# Patient Record
Sex: Male | Born: 1945 | Race: White | Hispanic: No | Smoking: Never smoker
Health system: Southern US, Community
[De-identification: ages and names within clinical notes are randomized; demographics above are authoritative.]

## PROBLEM LIST (undated history)

## (undated) DIAGNOSIS — K635 Polyp of colon: Secondary | ICD-10-CM

## (undated) DIAGNOSIS — C61 Malignant neoplasm of prostate: Secondary | ICD-10-CM

## (undated) DIAGNOSIS — K5790 Diverticulosis of intestine, part unspecified, without perforation or abscess without bleeding: Secondary | ICD-10-CM

## (undated) DIAGNOSIS — K5792 Diverticulitis of intestine, part unspecified, without perforation or abscess without bleeding: Secondary | ICD-10-CM

## (undated) HISTORY — DX: Polyp of colon: K63.5

## (undated) HISTORY — DX: Diverticulitis of intestine, part unspecified, without perforation or abscess without bleeding: K57.92

## (undated) HISTORY — DX: Diverticulosis of intestine, part unspecified, without perforation or abscess without bleeding: K57.90

## (undated) HISTORY — PX: PROSTATE BIOPSY: SHX241

---
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
## 2006-04-22 ENCOUNTER — Ambulatory Visit: Payer: Self-pay | Admitting: Internal Medicine

## 2006-05-12 ENCOUNTER — Ambulatory Visit: Payer: Self-pay | Admitting: Internal Medicine

## 2006-05-12 ENCOUNTER — Encounter (INDEPENDENT_AMBULATORY_CARE_PROVIDER_SITE_OTHER): Payer: Self-pay | Admitting: Specialist

## 2009-04-18 ENCOUNTER — Ambulatory Visit: Payer: Self-pay | Admitting: Internal Medicine

## 2009-05-01 ENCOUNTER — Ambulatory Visit: Payer: Self-pay | Admitting: Internal Medicine

## 2009-05-01 ENCOUNTER — Encounter: Payer: Self-pay | Admitting: Internal Medicine

## 2009-05-02 ENCOUNTER — Encounter: Payer: Self-pay | Admitting: Internal Medicine

## 2011-06-17 ENCOUNTER — Encounter: Payer: Self-pay | Admitting: Podiatry

## 2011-07-15 ENCOUNTER — Other Ambulatory Visit: Payer: Self-pay | Admitting: Dermatology

## 2011-11-19 HISTORY — PX: COLONOSCOPY: SHX174

## 2012-03-13 ENCOUNTER — Encounter: Payer: Self-pay | Admitting: Internal Medicine

## 2012-03-18 ENCOUNTER — Encounter: Payer: Self-pay | Admitting: Internal Medicine

## 2012-04-14 DIAGNOSIS — H40019 Open angle with borderline findings, low risk, unspecified eye: Secondary | ICD-10-CM | POA: Diagnosis not present

## 2012-05-06 DIAGNOSIS — E785 Hyperlipidemia, unspecified: Secondary | ICD-10-CM | POA: Diagnosis not present

## 2012-05-06 DIAGNOSIS — Z125 Encounter for screening for malignant neoplasm of prostate: Secondary | ICD-10-CM | POA: Diagnosis not present

## 2012-05-06 DIAGNOSIS — R972 Elevated prostate specific antigen [PSA]: Secondary | ICD-10-CM | POA: Diagnosis not present

## 2012-05-13 DIAGNOSIS — I452 Bifascicular block: Secondary | ICD-10-CM | POA: Diagnosis not present

## 2012-05-13 DIAGNOSIS — E785 Hyperlipidemia, unspecified: Secondary | ICD-10-CM | POA: Diagnosis not present

## 2012-05-13 DIAGNOSIS — Z0000 Encounter for general adult medical examination without abnormal findings: Secondary | ICD-10-CM | POA: Diagnosis not present

## 2012-05-13 DIAGNOSIS — Z125 Encounter for screening for malignant neoplasm of prostate: Secondary | ICD-10-CM | POA: Diagnosis not present

## 2012-05-13 DIAGNOSIS — K5730 Diverticulosis of large intestine without perforation or abscess without bleeding: Secondary | ICD-10-CM | POA: Diagnosis not present

## 2012-05-18 ENCOUNTER — Ambulatory Visit (AMBULATORY_SURGERY_CENTER): Payer: Medicare Other | Admitting: *Deleted

## 2012-05-18 VITALS — Ht 66.0 in | Wt 165.0 lb

## 2012-05-18 DIAGNOSIS — Z1211 Encounter for screening for malignant neoplasm of colon: Secondary | ICD-10-CM

## 2012-05-18 MED ORDER — MOVIPREP 100 G PO SOLR: ORAL | Status: DC

## 2012-06-01 ENCOUNTER — Ambulatory Visit (AMBULATORY_SURGERY_CENTER): Payer: Medicare Other | Admitting: Internal Medicine

## 2012-06-01 ENCOUNTER — Encounter: Payer: Self-pay | Admitting: Internal Medicine

## 2012-06-01 VITALS — BP 183/79 | HR 95 | Temp 97.3°F | Resp 18 | Ht 66.0 in | Wt 165.0 lb

## 2012-06-01 DIAGNOSIS — Z86010 Personal history of colonic polyps: Secondary | ICD-10-CM | POA: Diagnosis not present

## 2012-06-01 DIAGNOSIS — Z1211 Encounter for screening for malignant neoplasm of colon: Secondary | ICD-10-CM

## 2012-06-01 DIAGNOSIS — D126 Benign neoplasm of colon, unspecified: Secondary | ICD-10-CM

## 2012-06-01 MED ORDER — SODIUM CHLORIDE 0.9 % IV SOLN: 500.0000 mL | INTRAVENOUS | Status: DC

## 2012-06-01 NOTE — Progress Notes (Signed)
Patient did not have preoperative order for IV antibiotic SSI prophylaxis. (G8918)  Patient did not experience any of the following events: a burn prior to discharge; a fall within the facility; wrong site/side/patient/procedure/implant event; or a hospital transfer or hospital admission upon discharge from the facility. (G8907)  

## 2012-06-01 NOTE — Op Note (Signed)
[facility] [address] [place], [place]  [address]  COLONOSCOPY PROCEDURE REPORT  PATIENT:  Terry Gay, Terry Gay  MR#:  [id number] BIRTHDATE:  21-Aug-1946, 66 yrs. old  GENDER:  male ENDOSCOPIST:  Wilhemina Bonito. Eda Keys, MD REF. BY:  Surveillance Program Recall, PROCEDURE DATE:  06/01/2012 PROCEDURE:  Colonoscopy with snare polypectomy x 2 ASA CLASS:  Class II INDICATIONS:  history of pre-cancerous (adenomatous) colon polyps, surveillance and high-risk screening ; prior exams 2003.04,07,10 w/ multiple adenomas MEDICATIONS:   MAC sedation, administered by CRNA, propofol (Diprivan) 350 mg IV  DESCRIPTION OF PROCEDURE:   After the risks benefits and alternatives of the procedure were thoroughly explained, informed consent was obtained.  Digital rectal exam was performed and revealed no abnormalities.   The LB [id number] endoscope was introduced through the anus and advanced to the cecum, which was identified by both the appendix and ileocecal valve, without limitations.  The quality of the prep was excellent, using MoviPrep.  The instrument was then slowly withdrawn as the colon was fully examined. <<PROCEDUREIMAGES>>  FINDINGS:  Two 3mm polyps were found in the cecum and ascending colon. Polyps were snared without cautery. Retrieval was successful.  Moderate diverticulosis was found throughout the colon.  Otherwise normal colonoscopy without other polyps, masses, vascular ectasias, or inflammatory changes.   Retroflexed views in the rectum revealed internal hemorrhoids.    The time to cecum = 2:45  minutes. The scope was then withdrawn in  9:45  minutes from the cecum and the procedure completed.  COMPLICATIONS:  None  ENDOSCOPIC IMPRESSION: 1) Two tiny polyps - removed 2) Moderate diverticulosis throughout the colon 3) Otherwise normal colonoscopy  RECOMMENDATIONS: 1) Follow up colonoscopy in 5 years  ______________________________ Wilhemina Bonito. Eda Keys, MD  CC:   The Patient;  Guerry Bruin, MD  n. eSIGNED:   Wilhemina Bonito. Eda Keys at 06/01/2012 10:00 AM  Burnett Harry, [phone]

## 2012-06-01 NOTE — Patient Instructions (Addendum)
YOU HAD AN ENDOSCOPIC PROCEDURE TODAY AT THE [facility]: Refer to the procedure report that was given to you for any specific questions about what was found during the examination.  If the procedure report does not answer your questions, please call your gastroenterologist to clarify.  If you requested that your care partner not be given the details of your procedure findings, then the procedure report has been included in a sealed envelope for you to review at your convenience later.  YOU SHOULD EXPECT: Some feelings of bloating in the abdomen. Passage of more gas than usual.  Walking can help get rid of the air that was put into your GI tract during the procedure and reduce the bloating. If you had a lower endoscopy (such as a colonoscopy or flexible sigmoidoscopy) you may notice spotting of blood in your stool or on the toilet paper. If you underwent a bowel prep for your procedure, then you may not have a normal bowel movement for a few days.  DIET: Your first meal following the procedure should be a light meal and then it is ok to progress to your normal diet.  A half-sandwich or bowl of soup is an example of a good first meal.  Heavy or fried foods are harder to digest and may make you feel nauseous or bloated.  Likewise meals heavy in dairy and vegetables can cause extra gas to form and this can also increase the bloating.  Drink plenty of fluids but you should avoid alcoholic beverages for 24 hours.  ACTIVITY: Your care partner should take you home directly after the procedure.  You should plan to take it easy, moving slowly for the rest of the day.  You can resume normal activity the day after the procedure however you should NOT DRIVE or use heavy machinery for 24 hours (because of the sedation medicines used during the test).    SYMPTOMS TO REPORT IMMEDIATELY: A gastroenterologist can be reached at any hour.  During normal business hours, 8:30 AM to 5:00 PM Monday through Friday,  call [phone].  After hours and on weekends, please call the GI answering service at [phone] who will take a message and have the physician on call contact you.   Following lower endoscopy (colonoscopy or flexible sigmoidoscopy):  Excessive amounts of blood in the stool  Significant tenderness or worsening of abdominal pains  Swelling of the abdomen that is new, acute  Fever of 100F or higher    FOLLOW UP: If any biopsies were taken you will be contacted by phone or by letter within the next 1-3 weeks.  Call your gastroenterologist if you have not heard about the biopsies in 3 weeks.  Our staff will call the home number listed on your records the next business day following your procedure to check on you and address any questions or concerns that you may have at that time regarding the information given to you following your procedure. This is a courtesy call and so if there is no answer at the home number and we have not heard from you through the emergency physician on call, we will assume that you have returned to your regular daily activities without incident.  SIGNATURES/CONFIDENTIALITY: You and/or your care partner have signed paperwork which will be entered into your electronic medical record.  These signatures attest to the fact that that the information above on your After Visit Summary has been reviewed and is understood.  Full responsibility of the confidentiality   of this discharge information lies with you and/or your care-partner.     

## 2012-06-02 ENCOUNTER — Telehealth: Payer: Self-pay | Admitting: *Deleted

## 2012-06-02 NOTE — Telephone Encounter (Signed)
  Follow up Call-  Call back number 06/01/2012  Post procedure Call Back phone  # [phone]  Permission to leave phone message Yes     Patient questions:  Do you have a fever, pain , or abdominal swelling? no Pain Score  0 *  Have you tolerated food without any problems? yes  Have you been able to return to your normal activities? yes  Do you have any questions about your discharge instructions: Diet   no Medications  no Follow up visit  no  Do you have questions or concerns about your Care? no  Actions: * If pain score is 4 or above: No action needed, pain <4.

## 2012-06-04 ENCOUNTER — Encounter: Payer: Self-pay | Admitting: Internal Medicine

## 2012-06-18 DIAGNOSIS — Z1212 Encounter for screening for malignant neoplasm of rectum: Secondary | ICD-10-CM | POA: Diagnosis not present

## 2012-10-26 DIAGNOSIS — L723 Sebaceous cyst: Secondary | ICD-10-CM | POA: Diagnosis not present

## 2013-01-28 DIAGNOSIS — H40019 Open angle with borderline findings, low risk, unspecified eye: Secondary | ICD-10-CM | POA: Diagnosis not present

## 2013-05-24 DIAGNOSIS — N529 Male erectile dysfunction, unspecified: Secondary | ICD-10-CM | POA: Diagnosis not present

## 2013-05-24 DIAGNOSIS — E785 Hyperlipidemia, unspecified: Secondary | ICD-10-CM | POA: Diagnosis not present

## 2013-05-24 DIAGNOSIS — Z125 Encounter for screening for malignant neoplasm of prostate: Secondary | ICD-10-CM | POA: Diagnosis not present

## 2013-05-31 DIAGNOSIS — Z6828 Body mass index (BMI) 28.0-28.9, adult: Secondary | ICD-10-CM | POA: Diagnosis not present

## 2013-05-31 DIAGNOSIS — Z0000 Encounter for general adult medical examination without abnormal findings: Secondary | ICD-10-CM | POA: Diagnosis not present

## 2013-05-31 DIAGNOSIS — D126 Benign neoplasm of colon, unspecified: Secondary | ICD-10-CM | POA: Diagnosis not present

## 2013-05-31 DIAGNOSIS — E785 Hyperlipidemia, unspecified: Secondary | ICD-10-CM | POA: Diagnosis not present

## 2013-05-31 DIAGNOSIS — F528 Other sexual dysfunction not due to a substance or known physiological condition: Secondary | ICD-10-CM | POA: Diagnosis not present

## 2013-05-31 DIAGNOSIS — Z125 Encounter for screening for malignant neoplasm of prostate: Secondary | ICD-10-CM | POA: Diagnosis not present

## 2013-05-31 DIAGNOSIS — H409 Unspecified glaucoma: Secondary | ICD-10-CM | POA: Diagnosis not present

## 2013-05-31 DIAGNOSIS — I10 Essential (primary) hypertension: Secondary | ICD-10-CM | POA: Diagnosis not present

## 2013-05-31 DIAGNOSIS — I452 Bifascicular block: Secondary | ICD-10-CM | POA: Diagnosis not present

## 2013-06-11 DIAGNOSIS — Z1212 Encounter for screening for malignant neoplasm of rectum: Secondary | ICD-10-CM | POA: Diagnosis not present

## 2013-10-20 DIAGNOSIS — Z23 Encounter for immunization: Secondary | ICD-10-CM | POA: Diagnosis not present

## 2013-10-21 DIAGNOSIS — Z23 Encounter for immunization: Secondary | ICD-10-CM | POA: Diagnosis not present

## 2014-01-27 DIAGNOSIS — H2510 Age-related nuclear cataract, unspecified eye: Secondary | ICD-10-CM | POA: Diagnosis not present

## 2014-01-27 DIAGNOSIS — H40019 Open angle with borderline findings, low risk, unspecified eye: Secondary | ICD-10-CM | POA: Diagnosis not present

## 2014-01-27 DIAGNOSIS — H35379 Puckering of macula, unspecified eye: Secondary | ICD-10-CM | POA: Diagnosis not present

## 2014-05-26 DIAGNOSIS — Z125 Encounter for screening for malignant neoplasm of prostate: Secondary | ICD-10-CM | POA: Diagnosis not present

## 2014-05-26 DIAGNOSIS — R82998 Other abnormal findings in urine: Secondary | ICD-10-CM | POA: Diagnosis not present

## 2014-05-26 DIAGNOSIS — R809 Proteinuria, unspecified: Secondary | ICD-10-CM | POA: Diagnosis not present

## 2014-05-26 DIAGNOSIS — I10 Essential (primary) hypertension: Secondary | ICD-10-CM | POA: Diagnosis not present

## 2014-05-26 DIAGNOSIS — E785 Hyperlipidemia, unspecified: Secondary | ICD-10-CM | POA: Diagnosis not present

## 2014-06-03 DIAGNOSIS — Z1331 Encounter for screening for depression: Secondary | ICD-10-CM | POA: Diagnosis not present

## 2014-06-03 DIAGNOSIS — I452 Bifascicular block: Secondary | ICD-10-CM | POA: Diagnosis not present

## 2014-06-03 DIAGNOSIS — F528 Other sexual dysfunction not due to a substance or known physiological condition: Secondary | ICD-10-CM | POA: Diagnosis not present

## 2014-06-03 DIAGNOSIS — Z0000 Encounter for general adult medical examination without abnormal findings: Secondary | ICD-10-CM | POA: Diagnosis not present

## 2014-06-03 DIAGNOSIS — Z23 Encounter for immunization: Secondary | ICD-10-CM | POA: Diagnosis not present

## 2014-06-03 DIAGNOSIS — Z125 Encounter for screening for malignant neoplasm of prostate: Secondary | ICD-10-CM | POA: Diagnosis not present

## 2014-06-03 DIAGNOSIS — I10 Essential (primary) hypertension: Secondary | ICD-10-CM | POA: Diagnosis not present

## 2014-06-03 DIAGNOSIS — H409 Unspecified glaucoma: Secondary | ICD-10-CM | POA: Diagnosis not present

## 2014-06-03 DIAGNOSIS — E785 Hyperlipidemia, unspecified: Secondary | ICD-10-CM | POA: Diagnosis not present

## 2014-06-07 DIAGNOSIS — Z1212 Encounter for screening for malignant neoplasm of rectum: Secondary | ICD-10-CM | POA: Diagnosis not present

## 2014-06-24 ENCOUNTER — Encounter: Payer: Self-pay | Admitting: Internal Medicine

## 2014-12-05 DIAGNOSIS — Z23 Encounter for immunization: Secondary | ICD-10-CM | POA: Diagnosis not present

## 2015-02-20 DIAGNOSIS — H35373 Puckering of macula, bilateral: Secondary | ICD-10-CM | POA: Diagnosis not present

## 2015-02-20 DIAGNOSIS — H40013 Open angle with borderline findings, low risk, bilateral: Secondary | ICD-10-CM | POA: Diagnosis not present

## 2015-06-05 DIAGNOSIS — Z125 Encounter for screening for malignant neoplasm of prostate: Secondary | ICD-10-CM | POA: Diagnosis not present

## 2015-06-05 DIAGNOSIS — R8290 Unspecified abnormal findings in urine: Secondary | ICD-10-CM | POA: Diagnosis not present

## 2015-06-05 DIAGNOSIS — I10 Essential (primary) hypertension: Secondary | ICD-10-CM | POA: Diagnosis not present

## 2015-06-05 DIAGNOSIS — N390 Urinary tract infection, site not specified: Secondary | ICD-10-CM | POA: Diagnosis not present

## 2015-06-05 DIAGNOSIS — E785 Hyperlipidemia, unspecified: Secondary | ICD-10-CM | POA: Diagnosis not present

## 2015-06-05 DIAGNOSIS — R972 Elevated prostate specific antigen [PSA]: Secondary | ICD-10-CM | POA: Diagnosis not present

## 2015-06-12 DIAGNOSIS — D126 Benign neoplasm of colon, unspecified: Secondary | ICD-10-CM | POA: Diagnosis not present

## 2015-06-12 DIAGNOSIS — Z8719 Personal history of other diseases of the digestive system: Secondary | ICD-10-CM | POA: Diagnosis not present

## 2015-06-12 DIAGNOSIS — R972 Elevated prostate specific antigen [PSA]: Secondary | ICD-10-CM | POA: Diagnosis not present

## 2015-06-12 DIAGNOSIS — Z1389 Encounter for screening for other disorder: Secondary | ICD-10-CM | POA: Diagnosis not present

## 2015-06-12 DIAGNOSIS — F5221 Male erectile disorder: Secondary | ICD-10-CM | POA: Diagnosis not present

## 2015-06-12 DIAGNOSIS — I444 Left anterior fascicular block: Secondary | ICD-10-CM | POA: Diagnosis not present

## 2015-06-12 DIAGNOSIS — I10 Essential (primary) hypertension: Secondary | ICD-10-CM | POA: Diagnosis not present

## 2015-06-12 DIAGNOSIS — Z0000 Encounter for general adult medical examination without abnormal findings: Secondary | ICD-10-CM | POA: Diagnosis not present

## 2015-06-12 DIAGNOSIS — E785 Hyperlipidemia, unspecified: Secondary | ICD-10-CM | POA: Diagnosis not present

## 2015-06-12 DIAGNOSIS — H409 Unspecified glaucoma: Secondary | ICD-10-CM | POA: Diagnosis not present

## 2015-06-12 DIAGNOSIS — Z6826 Body mass index (BMI) 26.0-26.9, adult: Secondary | ICD-10-CM | POA: Diagnosis not present

## 2015-06-12 DIAGNOSIS — K5730 Diverticulosis of large intestine without perforation or abscess without bleeding: Secondary | ICD-10-CM | POA: Diagnosis not present

## 2015-06-13 DIAGNOSIS — Z1212 Encounter for screening for malignant neoplasm of rectum: Secondary | ICD-10-CM | POA: Diagnosis not present

## 2015-11-29 DIAGNOSIS — Z23 Encounter for immunization: Secondary | ICD-10-CM | POA: Diagnosis not present

## 2016-01-17 DIAGNOSIS — R69 Illness, unspecified: Secondary | ICD-10-CM | POA: Diagnosis not present

## 2016-02-22 DIAGNOSIS — Z0100 Encounter for examination of eyes and vision without abnormal findings: Secondary | ICD-10-CM | POA: Diagnosis not present

## 2016-02-22 DIAGNOSIS — H35373 Puckering of macula, bilateral: Secondary | ICD-10-CM | POA: Diagnosis not present

## 2016-02-22 DIAGNOSIS — H2513 Age-related nuclear cataract, bilateral: Secondary | ICD-10-CM | POA: Diagnosis not present

## 2016-03-04 DIAGNOSIS — R69 Illness, unspecified: Secondary | ICD-10-CM | POA: Diagnosis not present

## 2016-06-05 DIAGNOSIS — R69 Illness, unspecified: Secondary | ICD-10-CM | POA: Diagnosis not present

## 2016-06-12 DIAGNOSIS — R972 Elevated prostate specific antigen [PSA]: Secondary | ICD-10-CM | POA: Diagnosis not present

## 2016-06-12 DIAGNOSIS — E784 Other hyperlipidemia: Secondary | ICD-10-CM | POA: Diagnosis not present

## 2016-06-12 DIAGNOSIS — Z125 Encounter for screening for malignant neoplasm of prostate: Secondary | ICD-10-CM | POA: Diagnosis not present

## 2016-06-12 DIAGNOSIS — I10 Essential (primary) hypertension: Secondary | ICD-10-CM | POA: Diagnosis not present

## 2016-06-19 DIAGNOSIS — Z0000 Encounter for general adult medical examination without abnormal findings: Secondary | ICD-10-CM | POA: Diagnosis not present

## 2016-06-19 DIAGNOSIS — I10 Essential (primary) hypertension: Secondary | ICD-10-CM | POA: Diagnosis not present

## 2016-06-19 DIAGNOSIS — H409 Unspecified glaucoma: Secondary | ICD-10-CM | POA: Diagnosis not present

## 2016-06-19 DIAGNOSIS — D126 Benign neoplasm of colon, unspecified: Secondary | ICD-10-CM | POA: Diagnosis not present

## 2016-06-19 DIAGNOSIS — I444 Left anterior fascicular block: Secondary | ICD-10-CM | POA: Diagnosis not present

## 2016-06-19 DIAGNOSIS — K5730 Diverticulosis of large intestine without perforation or abscess without bleeding: Secondary | ICD-10-CM | POA: Diagnosis not present

## 2016-06-19 DIAGNOSIS — Z8719 Personal history of other diseases of the digestive system: Secondary | ICD-10-CM | POA: Diagnosis not present

## 2016-06-19 DIAGNOSIS — R972 Elevated prostate specific antigen [PSA]: Secondary | ICD-10-CM | POA: Diagnosis not present

## 2016-06-19 DIAGNOSIS — R69 Illness, unspecified: Secondary | ICD-10-CM | POA: Diagnosis not present

## 2016-06-19 DIAGNOSIS — Z6827 Body mass index (BMI) 27.0-27.9, adult: Secondary | ICD-10-CM | POA: Diagnosis not present

## 2016-06-20 DIAGNOSIS — Z1212 Encounter for screening for malignant neoplasm of rectum: Secondary | ICD-10-CM | POA: Diagnosis not present

## 2016-09-02 DIAGNOSIS — L719 Rosacea, unspecified: Secondary | ICD-10-CM | POA: Diagnosis not present

## 2016-09-02 DIAGNOSIS — Z23 Encounter for immunization: Secondary | ICD-10-CM | POA: Diagnosis not present

## 2016-09-02 DIAGNOSIS — Z6827 Body mass index (BMI) 27.0-27.9, adult: Secondary | ICD-10-CM | POA: Diagnosis not present

## 2016-10-08 DIAGNOSIS — R69 Illness, unspecified: Secondary | ICD-10-CM | POA: Diagnosis not present

## 2017-02-17 DIAGNOSIS — R69 Illness, unspecified: Secondary | ICD-10-CM | POA: Diagnosis not present

## 2017-02-24 DIAGNOSIS — H52203 Unspecified astigmatism, bilateral: Secondary | ICD-10-CM | POA: Diagnosis not present

## 2017-02-24 DIAGNOSIS — H2513 Age-related nuclear cataract, bilateral: Secondary | ICD-10-CM | POA: Diagnosis not present

## 2017-02-24 DIAGNOSIS — H35373 Puckering of macula, bilateral: Secondary | ICD-10-CM | POA: Diagnosis not present

## 2017-02-24 DIAGNOSIS — H40013 Open angle with borderline findings, low risk, bilateral: Secondary | ICD-10-CM | POA: Diagnosis not present

## 2017-03-27 ENCOUNTER — Encounter: Payer: Self-pay | Admitting: Internal Medicine

## 2017-04-04 ENCOUNTER — Encounter: Payer: Self-pay | Admitting: Internal Medicine

## 2017-05-28 ENCOUNTER — Ambulatory Visit (AMBULATORY_SURGERY_CENTER): Payer: Self-pay

## 2017-05-28 VITALS — Ht 65.0 in | Wt 171.8 lb

## 2017-05-28 DIAGNOSIS — Z860100 Personal history of colon polyps, unspecified: Secondary | ICD-10-CM

## 2017-05-28 MED ORDER — SUPREP BOWEL PREP KIT 17.5-3.13-1.6 GM/177ML PO SOLN: 1.0000 | Freq: Once | ORAL | 0 refills | Status: AC

## 2017-05-28 NOTE — Progress Notes (Signed)
No allergies to eggs or soy No past problems with anesthesia No diet meds No home oxygen  Declined emmi 

## 2017-06-02 ENCOUNTER — Encounter: Payer: Self-pay | Admitting: Internal Medicine

## 2017-06-10 DIAGNOSIS — I10 Essential (primary) hypertension: Secondary | ICD-10-CM | POA: Diagnosis not present

## 2017-06-10 DIAGNOSIS — E7800 Pure hypercholesterolemia, unspecified: Secondary | ICD-10-CM | POA: Diagnosis not present

## 2017-06-10 DIAGNOSIS — Z125 Encounter for screening for malignant neoplasm of prostate: Secondary | ICD-10-CM | POA: Diagnosis not present

## 2017-06-11 ENCOUNTER — Encounter: Payer: Self-pay | Admitting: Internal Medicine

## 2017-06-11 ENCOUNTER — Ambulatory Visit (AMBULATORY_SURGERY_CENTER): Payer: Medicare HMO | Admitting: Internal Medicine

## 2017-06-11 VITALS — BP 122/71 | HR 66 | Temp 98.4°F | Resp 16 | Ht 65.0 in | Wt 171.0 lb

## 2017-06-11 DIAGNOSIS — Z86010 Personal history of colonic polyps: Secondary | ICD-10-CM | POA: Diagnosis not present

## 2017-06-11 DIAGNOSIS — D120 Benign neoplasm of cecum: Secondary | ICD-10-CM | POA: Diagnosis not present

## 2017-06-11 DIAGNOSIS — D122 Benign neoplasm of ascending colon: Secondary | ICD-10-CM

## 2017-06-11 HISTORY — PX: COLONOSCOPY WITH PROPOFOL: SHX5780

## 2017-06-11 MED ORDER — SODIUM CHLORIDE 0.9 % IV SOLN: 500.0000 mL | INTRAVENOUS | Status: AC

## 2017-06-11 NOTE — Op Note (Signed)
[place] [facility] Patient Name: Terry Gay Procedure Date: 06/11/2017 1:28 PM MRN: [id number] Endoscopist: Docia Chuck. Henrene Pastor , MD Age: 71 Referring MD:  Date of Birth: 24-Aug-1946 Gender: Male Account #: [id number] Procedure:                Colonoscopy, with snare polypectomy x 3 Indications:              High risk colon cancer surveillance: Personal                            history of multiple (3 or more) adenomas. Previous                            examinations 2003, 2004, 2007, 2010, 2013 Medicines:                Monitored Anesthesia Care Procedure:                Pre-Anesthesia Assessment:                           - Prior to the procedure, a History and Physical                            was performed, and patient medications and                            allergies were reviewed. The patient's tolerance of                            previous anesthesia was also reviewed. The risks                            and benefits of the procedure and the sedation                            options and risks were discussed with the patient.                            All questions were answered, and informed consent                            was obtained. Prior Anticoagulants: The patient has                            taken no previous anticoagulant or antiplatelet                            agents. ASA Grade Assessment: II - A patient with                            mild systemic disease. After reviewing the risks                            and benefits, the patient was deemed in  satisfactory condition to undergo the procedure.                           After obtaining informed consent, the colonoscope                            was passed under direct vision. Throughout the                            procedure, the patient's blood pressure, pulse, and                            oxygen saturations were monitored continuously. The    Colonoscope was introduced through the anus and                            advanced to the the cecum, identified by                            appendiceal orifice and ileocecal valve. The                            ileocecal valve, appendiceal orifice, and rectum                            were photographed. The quality of the bowel                            preparation was excellent. The colonoscopy was                            performed without difficulty. The patient tolerated                            the procedure well. The bowel preparation used was                            SUPREP. Scope In: 1:32:09 PM Scope Out: 1:48:19 PM Scope Withdrawal Time: 0 hours 11 minutes 52 seconds  Total Procedure Duration: 0 hours 16 minutes 10 seconds  Findings:                 Three polyps were found in the ascending colon and                            cecum. The polyps were 1 to 3 mm in size. These                            polyps were removed with a cold snare. Resection                            and retrieval were complete.                           Multiple small and large-mouthed  diverticula were                            found in the left colon and right colon.                           Internal hemorrhoids were found during retroflexion.                           The exam was otherwise without abnormality on                            direct and retroflexion views. Complications:            No immediate complications. Estimated blood loss:                            None. Estimated Blood Loss:     Estimated blood loss: none. Impression:               - Three 1 to 3 mm polyps in the ascending colon and                            in the cecum, removed with a cold snare. Resected                            and retrieved.                           - Diverticulosis in the left colon and in the right                            colon.                           - Internal hemorrhoids.                            - The examination was otherwise normal on direct                            and retroflexion views. Recommendation:           - Repeat colonoscopy in 5 years for surveillance.                           - Patient has a contact number available for                            emergencies. The signs and symptoms of potential                            delayed complications were discussed with the                            patient. Return to normal activities tomorrow.  Written discharge instructions were provided to the                            patient.                           - Resume previous diet.                           - Continue present medications.                           - Await pathology results. Docia Chuck. Henrene Pastor, MD 06/11/2017 1:53:05 PM This report has been signed electronically.

## 2017-06-11 NOTE — Patient Instructions (Signed)
**  Handouts given on polyps, diverticulosis, and High Fiber Diet**  YOU HAD AN ENDOSCOPIC PROCEDURE TODAY: Refer to the procedure report and other information in the discharge instructions given to you for any specific questions about what was found during the examination. If this information does not answer your questions, please call [facility] office at [phone] to clarify.   YOU SHOULD EXPECT: Some feelings of bloating in the abdomen. Passage of more gas than usual. Walking can help get rid of the air that was put into your GI tract during the procedure and reduce the bloating. If you had a lower endoscopy (such as a colonoscopy or flexible sigmoidoscopy) you may notice spotting of blood in your stool or on the toilet paper. Some abdominal soreness may be present for a day or two, also.  DIET: Your first meal following the procedure should be a light meal and then it is ok to progress to your normal diet. A half-sandwich or bowl of soup is an example of a good first meal. Heavy or fried foods are harder to digest and may make you feel nauseous or bloated. Drink plenty of fluids but you should avoid alcoholic beverages for 24 hours. If you had a esophageal dilation, please see attached instructions for diet.    ACTIVITY: Your care partner should take you home directly after the procedure. You should plan to take it easy, moving slowly for the rest of the day. You can resume normal activity the day after the procedure however YOU SHOULD NOT DRIVE, use power tools, machinery or perform tasks that involve climbing or major physical exertion for 24 hours (because of the sedation medicines used during the test).   SYMPTOMS TO REPORT IMMEDIATELY: A gastroenterologist can be reached at any hour. Please call [phone]  for any of the following symptoms:  Following lower endoscopy (colonoscopy, flexible sigmoidoscopy) Excessive amounts of blood in the stool  Significant tenderness, worsening of abdominal  pains  Swelling of the abdomen that is new, acute  Fever of 100 or higher    FOLLOW UP:  If any biopsies were taken you will be contacted by phone or by letter within the next 1-3 weeks. Call [phone]  if you have not heard about the biopsies in 3 weeks.  Please also call with any specific questions about appointments or follow up tests.

## 2017-06-11 NOTE — Progress Notes (Signed)
Pt's states no medical or surgical changes since previsit or office visit. 

## 2017-06-11 NOTE — Progress Notes (Signed)
To recovery, report to Thomas, RN, VSS 

## 2017-06-11 NOTE — Progress Notes (Signed)
Called to room to assist during endoscopic procedure.  Patient ID and intended procedure confirmed with present staff. Received instructions for my participation in the procedure from the performing physician.  

## 2017-06-12 ENCOUNTER — Telehealth: Payer: Self-pay

## 2017-06-12 NOTE — Telephone Encounter (Signed)
  Follow up Call-  Call back number 06/11/2017  Post procedure Call Back phone  # [phone]  Permission to leave phone message Yes  Some recent data might be hidden     Patient questions:  Do you have a fever, pain , or abdominal swelling? No. Pain Score  0 *  Have you tolerated food without any problems? Yes.    Have you been able to return to your normal activities? Yes.    Do you have any questions about your discharge instructions: Diet   No. Medications  No. Follow up visit  No.  Do you have questions or concerns about your Care? No.  Actions: * If pain score is 4 or above: No action needed, pain <4.

## 2017-06-16 ENCOUNTER — Encounter: Payer: Self-pay | Admitting: Internal Medicine

## 2017-06-25 DIAGNOSIS — Z1389 Encounter for screening for other disorder: Secondary | ICD-10-CM | POA: Diagnosis not present

## 2017-06-25 DIAGNOSIS — D126 Benign neoplasm of colon, unspecified: Secondary | ICD-10-CM | POA: Diagnosis not present

## 2017-06-25 DIAGNOSIS — I444 Left anterior fascicular block: Secondary | ICD-10-CM | POA: Diagnosis not present

## 2017-06-25 DIAGNOSIS — K5730 Diverticulosis of large intestine without perforation or abscess without bleeding: Secondary | ICD-10-CM | POA: Diagnosis not present

## 2017-06-25 DIAGNOSIS — R69 Illness, unspecified: Secondary | ICD-10-CM | POA: Diagnosis not present

## 2017-06-25 DIAGNOSIS — L718 Other rosacea: Secondary | ICD-10-CM | POA: Diagnosis not present

## 2017-06-25 DIAGNOSIS — I10 Essential (primary) hypertension: Secondary | ICD-10-CM | POA: Diagnosis not present

## 2017-06-25 DIAGNOSIS — H4089 Other specified glaucoma: Secondary | ICD-10-CM | POA: Diagnosis not present

## 2017-06-25 DIAGNOSIS — Z8719 Personal history of other diseases of the digestive system: Secondary | ICD-10-CM | POA: Diagnosis not present

## 2017-06-25 DIAGNOSIS — Z0000 Encounter for general adult medical examination without abnormal findings: Secondary | ICD-10-CM | POA: Diagnosis not present

## 2017-07-14 DIAGNOSIS — R69 Illness, unspecified: Secondary | ICD-10-CM | POA: Diagnosis not present

## 2017-09-23 DIAGNOSIS — R69 Illness, unspecified: Secondary | ICD-10-CM | POA: Diagnosis not present

## 2017-10-03 DIAGNOSIS — R69 Illness, unspecified: Secondary | ICD-10-CM | POA: Diagnosis not present

## 2017-10-07 DIAGNOSIS — R69 Illness, unspecified: Secondary | ICD-10-CM | POA: Diagnosis not present

## 2017-12-26 DIAGNOSIS — Z1389 Encounter for screening for other disorder: Secondary | ICD-10-CM | POA: Diagnosis not present

## 2017-12-26 DIAGNOSIS — Z6828 Body mass index (BMI) 28.0-28.9, adult: Secondary | ICD-10-CM | POA: Diagnosis not present

## 2017-12-26 DIAGNOSIS — M76899 Other specified enthesopathies of unspecified lower limb, excluding foot: Secondary | ICD-10-CM | POA: Diagnosis not present

## 2017-12-31 DIAGNOSIS — R69 Illness, unspecified: Secondary | ICD-10-CM | POA: Diagnosis not present

## 2018-02-25 DIAGNOSIS — H35373 Puckering of macula, bilateral: Secondary | ICD-10-CM | POA: Diagnosis not present

## 2018-02-25 DIAGNOSIS — Z961 Presence of intraocular lens: Secondary | ICD-10-CM | POA: Diagnosis not present

## 2018-02-25 DIAGNOSIS — H52203 Unspecified astigmatism, bilateral: Secondary | ICD-10-CM | POA: Diagnosis not present

## 2018-05-19 DIAGNOSIS — R69 Illness, unspecified: Secondary | ICD-10-CM | POA: Diagnosis not present

## 2018-06-29 DIAGNOSIS — R791 Abnormal coagulation profile: Secondary | ICD-10-CM | POA: Diagnosis not present

## 2018-06-29 DIAGNOSIS — I10 Essential (primary) hypertension: Secondary | ICD-10-CM | POA: Diagnosis not present

## 2018-06-29 DIAGNOSIS — R972 Elevated prostate specific antigen [PSA]: Secondary | ICD-10-CM | POA: Diagnosis not present

## 2018-06-29 DIAGNOSIS — R82998 Other abnormal findings in urine: Secondary | ICD-10-CM | POA: Diagnosis not present

## 2018-06-29 DIAGNOSIS — E7800 Pure hypercholesterolemia, unspecified: Secondary | ICD-10-CM | POA: Diagnosis not present

## 2018-06-29 DIAGNOSIS — Z125 Encounter for screening for malignant neoplasm of prostate: Secondary | ICD-10-CM | POA: Diagnosis not present

## 2018-07-06 DIAGNOSIS — D692 Other nonthrombocytopenic purpura: Secondary | ICD-10-CM | POA: Diagnosis not present

## 2018-07-06 DIAGNOSIS — L718 Other rosacea: Secondary | ICD-10-CM | POA: Diagnosis not present

## 2018-07-06 DIAGNOSIS — R972 Elevated prostate specific antigen [PSA]: Secondary | ICD-10-CM | POA: Diagnosis not present

## 2018-07-06 DIAGNOSIS — K5730 Diverticulosis of large intestine without perforation or abscess without bleeding: Secondary | ICD-10-CM | POA: Diagnosis not present

## 2018-07-06 DIAGNOSIS — I444 Left anterior fascicular block: Secondary | ICD-10-CM | POA: Diagnosis not present

## 2018-07-06 DIAGNOSIS — Z0000 Encounter for general adult medical examination without abnormal findings: Secondary | ICD-10-CM | POA: Diagnosis not present

## 2018-07-06 DIAGNOSIS — R69 Illness, unspecified: Secondary | ICD-10-CM | POA: Diagnosis not present

## 2018-07-06 DIAGNOSIS — D126 Benign neoplasm of colon, unspecified: Secondary | ICD-10-CM | POA: Diagnosis not present

## 2018-07-06 DIAGNOSIS — I10 Essential (primary) hypertension: Secondary | ICD-10-CM | POA: Diagnosis not present

## 2018-07-06 DIAGNOSIS — Z8719 Personal history of other diseases of the digestive system: Secondary | ICD-10-CM | POA: Diagnosis not present

## 2018-07-10 DIAGNOSIS — Z1212 Encounter for screening for malignant neoplasm of rectum: Secondary | ICD-10-CM | POA: Diagnosis not present

## 2018-08-13 DIAGNOSIS — L570 Actinic keratosis: Secondary | ICD-10-CM | POA: Diagnosis not present

## 2018-08-13 DIAGNOSIS — L812 Freckles: Secondary | ICD-10-CM | POA: Diagnosis not present

## 2018-08-13 DIAGNOSIS — L821 Other seborrheic keratosis: Secondary | ICD-10-CM | POA: Diagnosis not present

## 2018-08-13 DIAGNOSIS — D1801 Hemangioma of skin and subcutaneous tissue: Secondary | ICD-10-CM | POA: Diagnosis not present

## 2018-08-31 DIAGNOSIS — R69 Illness, unspecified: Secondary | ICD-10-CM | POA: Diagnosis not present

## 2018-09-14 DIAGNOSIS — R69 Illness, unspecified: Secondary | ICD-10-CM | POA: Diagnosis not present

## 2019-01-06 DIAGNOSIS — R69 Illness, unspecified: Secondary | ICD-10-CM | POA: Diagnosis not present

## 2019-04-26 DIAGNOSIS — H35373 Puckering of macula, bilateral: Secondary | ICD-10-CM | POA: Diagnosis not present

## 2019-04-26 DIAGNOSIS — H52203 Unspecified astigmatism, bilateral: Secondary | ICD-10-CM | POA: Diagnosis not present

## 2019-04-26 DIAGNOSIS — H2513 Age-related nuclear cataract, bilateral: Secondary | ICD-10-CM | POA: Diagnosis not present

## 2019-05-05 DIAGNOSIS — R69 Illness, unspecified: Secondary | ICD-10-CM | POA: Diagnosis not present

## 2019-07-02 DIAGNOSIS — I10 Essential (primary) hypertension: Secondary | ICD-10-CM | POA: Diagnosis not present

## 2019-07-02 DIAGNOSIS — R972 Elevated prostate specific antigen [PSA]: Secondary | ICD-10-CM | POA: Diagnosis not present

## 2019-07-02 DIAGNOSIS — R82998 Other abnormal findings in urine: Secondary | ICD-10-CM | POA: Diagnosis not present

## 2019-07-02 DIAGNOSIS — Z125 Encounter for screening for malignant neoplasm of prostate: Secondary | ICD-10-CM | POA: Diagnosis not present

## 2019-07-08 DIAGNOSIS — E7800 Pure hypercholesterolemia, unspecified: Secondary | ICD-10-CM | POA: Diagnosis not present

## 2019-07-08 DIAGNOSIS — I10 Essential (primary) hypertension: Secondary | ICD-10-CM | POA: Diagnosis not present

## 2019-07-08 DIAGNOSIS — L719 Rosacea, unspecified: Secondary | ICD-10-CM | POA: Diagnosis not present

## 2019-07-08 DIAGNOSIS — Z1339 Encounter for screening examination for other mental health and behavioral disorders: Secondary | ICD-10-CM | POA: Diagnosis not present

## 2019-07-08 DIAGNOSIS — R69 Illness, unspecified: Secondary | ICD-10-CM | POA: Diagnosis not present

## 2019-07-08 DIAGNOSIS — Z23 Encounter for immunization: Secondary | ICD-10-CM | POA: Diagnosis not present

## 2019-07-08 DIAGNOSIS — R972 Elevated prostate specific antigen [PSA]: Secondary | ICD-10-CM | POA: Diagnosis not present

## 2019-07-08 DIAGNOSIS — D126 Benign neoplasm of colon, unspecified: Secondary | ICD-10-CM | POA: Diagnosis not present

## 2019-07-08 DIAGNOSIS — N401 Enlarged prostate with lower urinary tract symptoms: Secondary | ICD-10-CM | POA: Diagnosis not present

## 2019-07-08 DIAGNOSIS — Z0000 Encounter for general adult medical examination without abnormal findings: Secondary | ICD-10-CM | POA: Diagnosis not present

## 2019-07-08 DIAGNOSIS — H409 Unspecified glaucoma: Secondary | ICD-10-CM | POA: Diagnosis not present

## 2019-08-23 DIAGNOSIS — L821 Other seborrheic keratosis: Secondary | ICD-10-CM | POA: Diagnosis not present

## 2019-08-23 DIAGNOSIS — L718 Other rosacea: Secondary | ICD-10-CM | POA: Diagnosis not present

## 2019-08-23 DIAGNOSIS — L820 Inflamed seborrheic keratosis: Secondary | ICD-10-CM | POA: Diagnosis not present

## 2019-08-23 DIAGNOSIS — L812 Freckles: Secondary | ICD-10-CM | POA: Diagnosis not present

## 2019-08-23 DIAGNOSIS — D1801 Hemangioma of skin and subcutaneous tissue: Secondary | ICD-10-CM | POA: Diagnosis not present

## 2019-09-01 DIAGNOSIS — R69 Illness, unspecified: Secondary | ICD-10-CM | POA: Diagnosis not present

## 2019-09-30 DIAGNOSIS — D23122 Other benign neoplasm of skin of left lower eyelid, including canthus: Secondary | ICD-10-CM | POA: Diagnosis not present

## 2019-10-01 ENCOUNTER — Other Ambulatory Visit: Payer: Self-pay | Admitting: Ophthalmology

## 2019-10-01 DIAGNOSIS — L570 Actinic keratosis: Secondary | ICD-10-CM | POA: Diagnosis not present

## 2019-10-01 DIAGNOSIS — D485 Neoplasm of uncertain behavior of skin: Secondary | ICD-10-CM | POA: Diagnosis not present

## 2019-11-19 DIAGNOSIS — C61 Malignant neoplasm of prostate: Secondary | ICD-10-CM

## 2019-11-19 HISTORY — DX: Malignant neoplasm of prostate: C61

## 2020-01-03 DIAGNOSIS — R69 Illness, unspecified: Secondary | ICD-10-CM | POA: Diagnosis not present

## 2020-01-10 DIAGNOSIS — R69 Illness, unspecified: Secondary | ICD-10-CM | POA: Diagnosis not present

## 2020-01-14 ENCOUNTER — Ambulatory Visit: Payer: Medicare HMO

## 2020-04-28 DIAGNOSIS — H2513 Age-related nuclear cataract, bilateral: Secondary | ICD-10-CM | POA: Diagnosis not present

## 2020-04-28 DIAGNOSIS — H524 Presbyopia: Secondary | ICD-10-CM | POA: Diagnosis not present

## 2020-04-28 DIAGNOSIS — H52203 Unspecified astigmatism, bilateral: Secondary | ICD-10-CM | POA: Diagnosis not present

## 2020-04-28 DIAGNOSIS — H35373 Puckering of macula, bilateral: Secondary | ICD-10-CM | POA: Diagnosis not present

## 2020-05-08 DIAGNOSIS — R69 Illness, unspecified: Secondary | ICD-10-CM | POA: Diagnosis not present

## 2020-07-03 DIAGNOSIS — E7800 Pure hypercholesterolemia, unspecified: Secondary | ICD-10-CM | POA: Diagnosis not present

## 2020-07-10 DIAGNOSIS — H409 Unspecified glaucoma: Secondary | ICD-10-CM | POA: Diagnosis not present

## 2020-07-10 DIAGNOSIS — R972 Elevated prostate specific antigen [PSA]: Secondary | ICD-10-CM | POA: Diagnosis not present

## 2020-07-10 DIAGNOSIS — Z1212 Encounter for screening for malignant neoplasm of rectum: Secondary | ICD-10-CM | POA: Diagnosis not present

## 2020-07-10 DIAGNOSIS — E7800 Pure hypercholesterolemia, unspecified: Secondary | ICD-10-CM | POA: Diagnosis not present

## 2020-07-10 DIAGNOSIS — R82998 Other abnormal findings in urine: Secondary | ICD-10-CM | POA: Diagnosis not present

## 2020-07-10 DIAGNOSIS — K5730 Diverticulosis of large intestine without perforation or abscess without bleeding: Secondary | ICD-10-CM | POA: Diagnosis not present

## 2020-07-10 DIAGNOSIS — I444 Left anterior fascicular block: Secondary | ICD-10-CM | POA: Diagnosis not present

## 2020-07-10 DIAGNOSIS — D126 Benign neoplasm of colon, unspecified: Secondary | ICD-10-CM | POA: Diagnosis not present

## 2020-07-10 DIAGNOSIS — R69 Illness, unspecified: Secondary | ICD-10-CM | POA: Diagnosis not present

## 2020-07-10 DIAGNOSIS — Z125 Encounter for screening for malignant neoplasm of prostate: Secondary | ICD-10-CM | POA: Diagnosis not present

## 2020-07-10 DIAGNOSIS — Z0000 Encounter for general adult medical examination without abnormal findings: Secondary | ICD-10-CM | POA: Diagnosis not present

## 2020-07-10 DIAGNOSIS — I10 Essential (primary) hypertension: Secondary | ICD-10-CM | POA: Diagnosis not present

## 2020-07-10 DIAGNOSIS — N401 Enlarged prostate with lower urinary tract symptoms: Secondary | ICD-10-CM | POA: Diagnosis not present

## 2020-08-10 DIAGNOSIS — N400 Enlarged prostate without lower urinary tract symptoms: Secondary | ICD-10-CM | POA: Diagnosis not present

## 2020-08-10 DIAGNOSIS — R972 Elevated prostate specific antigen [PSA]: Secondary | ICD-10-CM | POA: Diagnosis not present

## 2020-08-28 DIAGNOSIS — L821 Other seborrheic keratosis: Secondary | ICD-10-CM | POA: Diagnosis not present

## 2020-08-28 DIAGNOSIS — L570 Actinic keratosis: Secondary | ICD-10-CM | POA: Diagnosis not present

## 2020-09-06 DIAGNOSIS — R69 Illness, unspecified: Secondary | ICD-10-CM | POA: Diagnosis not present

## 2020-09-14 DIAGNOSIS — R972 Elevated prostate specific antigen [PSA]: Secondary | ICD-10-CM | POA: Diagnosis not present

## 2020-09-14 DIAGNOSIS — N419 Inflammatory disease of prostate, unspecified: Secondary | ICD-10-CM | POA: Diagnosis not present

## 2020-09-14 DIAGNOSIS — C61 Malignant neoplasm of prostate: Secondary | ICD-10-CM | POA: Diagnosis not present

## 2020-09-20 DIAGNOSIS — R350 Frequency of micturition: Secondary | ICD-10-CM | POA: Diagnosis not present

## 2020-09-20 DIAGNOSIS — C61 Malignant neoplasm of prostate: Secondary | ICD-10-CM | POA: Diagnosis not present

## 2020-09-21 DIAGNOSIS — L738 Other specified follicular disorders: Secondary | ICD-10-CM | POA: Diagnosis not present

## 2020-09-22 ENCOUNTER — Other Ambulatory Visit (HOSPITAL_COMMUNITY): Payer: Self-pay | Admitting: Urology

## 2020-09-22 ENCOUNTER — Other Ambulatory Visit: Payer: Self-pay | Admitting: Urology

## 2020-09-22 DIAGNOSIS — C61 Malignant neoplasm of prostate: Secondary | ICD-10-CM

## 2020-10-06 ENCOUNTER — Encounter (HOSPITAL_COMMUNITY)
Admission: RE | Admit: 2020-10-06 | Discharge: 2020-10-06 | Disposition: A | Discharge status: Home / Self Care | Payer: Medicare HMO | Source: Ambulatory Visit | Attending: Urology | Admitting: Urology

## 2020-10-06 ENCOUNTER — Other Ambulatory Visit: Payer: Self-pay

## 2020-10-06 DIAGNOSIS — C61 Malignant neoplasm of prostate: Secondary | ICD-10-CM | POA: Diagnosis not present

## 2020-10-06 DIAGNOSIS — N200 Calculus of kidney: Secondary | ICD-10-CM | POA: Diagnosis not present

## 2020-10-06 DIAGNOSIS — K5730 Diverticulosis of large intestine without perforation or abscess without bleeding: Secondary | ICD-10-CM | POA: Diagnosis not present

## 2020-10-06 IMAGING — NM NM BONE WHOLE BODY
2 series · 2 of 2 positions shown · non-contrast
Comparison: CT abdomen and pelvis with bony reformats [DATE].

CLINICAL DATA: Prostate carcinoma

EXAM:
NUCLEAR MEDICINE WHOLE BODY BONE SCAN
TECHNIQUE: Whole body anterior and posterior images were obtained approximately
3 hours after intravenous injection of radiopharmaceutical.
RADIOPHARMACEUTICALS:  20.5 mCi [FI] MDP IV

[Series 1: whole body · 2.66mm/px · 1 of 1 slices shown (1 of 2)]
[im 1/1]
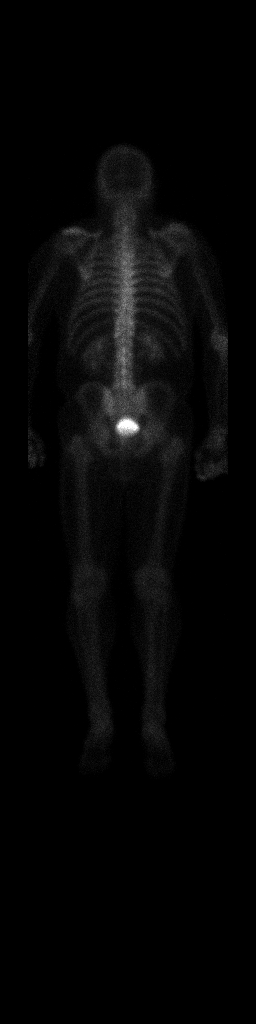

[Series 1: whole body · 2.66mm/px · 1 of 1 slices shown (2 of 2)]
[im 1/1]
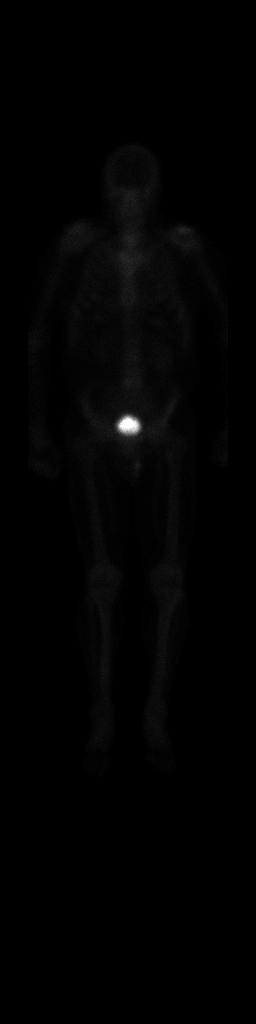

[2 of 2 positions shown; findings below may reference images not displayed]

FINDINGS: Modest increased uptake at L5 most likely is due to arthropathy
based on CT images through this area. Elsewhere, the distribution of
uptake of radiotracer is unremarkable. Kidneys are noted in the
flank positions bilaterally.
IMPRESSION: Probable arthropathy in the L5 region. No findings that are felt to
be indicative of bony metastatic disease.

## 2020-10-06 MED ORDER — TECHNETIUM TC 99M MEDRONATE IV KIT
20.5000 | PACK | Freq: Once | INTRAVENOUS | Status: AC
  Administered 2020-10-06: 20.5 via INTRAVENOUS

## 2020-10-26 DIAGNOSIS — C61 Malignant neoplasm of prostate: Secondary | ICD-10-CM | POA: Diagnosis not present

## 2020-11-13 ENCOUNTER — Encounter: Payer: Self-pay | Admitting: Radiation Oncology

## 2020-11-13 NOTE — Progress Notes (Signed)
GU Location of Tumor / Histology: prostatic adenocarcinoma  If Prostate Cancer, Gleason Score is (4 + 5) and PSA is (6.8). Prostate volume: 67.64  Terry Gay explains his PSA was over 5 last year. Patient explains his brother's PSA is elevated as well but he has not been diagnosed with prostate cancer.  Biopsies of prostate (if applicable) revealed:    Past/Anticipated interventions by urology, if any: prostate biopsy, CT abd/pelvis (negative), bone scan (negative), referral to Dr. Tammi Klippel to discuss radiation options  Past/Anticipated interventions by medical oncology, if any: no  Weight changes, if any: denies  Bowel/Bladder complaints, if any: IPSS 8. SHIM 22. Reports mild urinary frequency. Reports an intermittent urine stream. Reports nocturia x 2. Denies dysuria, hematuria, urinary leakage or incontinence.   Nausea/Vomiting, if any: denies  Pain issues, if any:  denies  SAFETY ISSUES:  Prior radiation? denies  Pacemaker/ICD? denies  Possible current pregnancy? no, male patient  Is the patient on methotrexate? denies  Current Complaints / other details:  74 year old male. Divorced with one son. Resides in [place]. Mother with hx of esophageal cancer. Brother with a hx of colon cancer.

## 2020-11-14 ENCOUNTER — Ambulatory Visit
Admission: RE | Admit: 2020-11-14 | Discharge: 2020-11-14 | Disposition: A | Discharge status: Home / Self Care | Payer: Medicare HMO | Source: Ambulatory Visit | Attending: Radiation Oncology | Admitting: Radiation Oncology

## 2020-11-14 ENCOUNTER — Other Ambulatory Visit: Payer: Self-pay

## 2020-11-14 ENCOUNTER — Encounter: Payer: Self-pay | Admitting: Radiation Oncology

## 2020-11-14 ENCOUNTER — Encounter: Payer: Self-pay | Admitting: Medical Oncology

## 2020-11-14 VITALS — BP 192/100 | HR 116 | Temp 97.8°F | Resp 18 | Ht 65.0 in | Wt 167.2 lb

## 2020-11-14 DIAGNOSIS — Z7982 Long term (current) use of aspirin: Secondary | ICD-10-CM | POA: Diagnosis not present

## 2020-11-14 DIAGNOSIS — Z800 Family history of malignant neoplasm of digestive organs: Secondary | ICD-10-CM | POA: Insufficient documentation

## 2020-11-14 DIAGNOSIS — C61 Malignant neoplasm of prostate: Secondary | ICD-10-CM | POA: Insufficient documentation

## 2020-11-14 DIAGNOSIS — Z86010 Personal history of colonic polyps: Secondary | ICD-10-CM | POA: Diagnosis not present

## 2020-11-14 DIAGNOSIS — N400 Enlarged prostate without lower urinary tract symptoms: Secondary | ICD-10-CM | POA: Diagnosis not present

## 2020-11-14 DIAGNOSIS — Z808 Family history of malignant neoplasm of other organs or systems: Secondary | ICD-10-CM | POA: Diagnosis not present

## 2020-11-14 HISTORY — DX: Malignant neoplasm of prostate: C61

## 2020-11-14 NOTE — Progress Notes (Signed)
Radiation Oncology         [phone] ________________________________  Initial outpatient Consultation  Name: Terry Gay MRN: [id number]  Date: 11/14/2020  DOB: [birth year]  EL:[id number], Adelfa Koh, MD  Jannifer Hick, MD   REFERRING PHYSICIAN: Jannifer Hick, MD  DIAGNOSIS: 74 y.o. gentleman with Stage T1c adenocarcinoma of the prostate with Gleason score of 4+5, and PSA of 6.8.    ICD-10-CM   1. Malignant neoplasm of prostate (HCC)  C61     HISTORY OF PRESENT ILLNESS: Terry Gay is a 74 y.o. male with a diagnosis of prostate cancer. He was noted to have an elevated PSA of 6.8 by his primary care physician, Dr. Wylene Simmer.  He reports he has a history of BPH and prostatitis, with a prior PSA of up to 12, that was treated with antibiotics and then returned to normal. He also notes that his PSA has been elevated a little over 5 intermittently over the years but he has always had a high %free PSA, indicating more likely secondary to the BPH.  More recently, his PSA was noted to be further elevated at 6.8 on 07/11/2020, so he was referred for evaluation in urology by Dr. Alvester Morin on 08/10/2020. A digital rectal examination was performed at that time revealing no nodules.  Following this visit, his care was transferred to Dr. Cardell Peach due to Dr. Alvester Morin being out on medical leave. The patient proceeded to transrectal ultrasound with 12 biopsies of the prostate on 09/14/2020.  The prostate volume measured 67.64 cc.  Out of 12 core biopsies, 6 were positive, all on the right.  The maximum Gleason score was 4+5, and this was seen in the right base (with PNI). Additionally, Gleason 4+4 was seen in the right base lateral, right mid lateral (with PNI), and right apex lateral, and Gleason 4+3 in the right apex (with PNI) and right mid.  He underwent disease staging scans on 10/06/2020 with CT A/P which showed no evidence of mass or lymphadenopathy and a bone scan which showed no findings felt to be indicative of  bony metastatic disease.  The patient reviewed the biopsy results with his urologist and he has kindly been referred today for discussion of potential radiation treatment options. He is scheduled to follow up with Dr. Cardell Peach on 11/20/2020.   PREVIOUS RADIATION THERAPY: No  PAST MEDICAL HISTORY:  Past Medical History:  Diagnosis Date  . Colon polyps   . Diverticulitis   . Diverticulosis   . Prostate cancer (HCC)       PAST SURGICAL HISTORY: Past Surgical History:  Procedure Laterality Date  . FRACTURE SURGERY  1958   right leg  . PROSTATE BIOPSY      FAMILY HISTORY:  Family History  Problem Relation Age of Onset  . Colon cancer Brother 17  . Esophageal cancer Mother   . Breast cancer Neg Hx   . Prostate cancer Neg Hx   . Pancreatic cancer Neg Hx     SOCIAL HISTORY:  Social History   Socioeconomic History  . Marital status: Divorced    Spouse name: Not on file  . Number of children: 1  . Years of education: Not on file  . Highest education level: Not on file  Occupational History    Comment: retired  Tobacco Use  . Smoking status: Never Smoker  . Smokeless tobacco: Never Used  Vaping Use  . Vaping Use: Never used  Substance and Sexual Activity  . Alcohol use: Yes  Alcohol/week: 10.0 standard drinks    Types: 10 Cans of beer per week  . Drug use: No  . Sexual activity: Yes  Other Topics Concern  . Not on file  Social History Narrative  . Not on file   Social Determinants of Health   Financial Resource Strain: Not on file  Food Insecurity: Not on file  Transportation Needs: Not on file  Physical Activity: Not on file  Stress: Not on file  Social Connections: Not on file  Intimate Partner Violence: Not on file    ALLERGIES: Patient has no known allergies.  MEDICATIONS:  Current Outpatient Medications  Medication Sig Dispense Refill  . naproxen sodium (ALEVE) 220 MG tablet Take 220 mg by mouth.    Marland Kitchen aspirin 325 MG tablet Take 325 mg by mouth every  6 (six) hours as needed. pain (Patient not taking: Reported on 11/14/2020)    . cetirizine (ZYRTEC) 10 MG chewable tablet Chew 10 mg by mouth daily. (Patient not taking: Reported on 11/14/2020)    . doxycycline (VIBRAMYCIN) 100 MG capsule Take 100 mg by mouth 2 (two) times daily. (Patient not taking: Reported on 11/14/2020)     No current facility-administered medications for this encounter.    REVIEW OF SYSTEMS:  On review of systems, the patient reports that he is doing well overall. He denies any chest pain, shortness of breath, cough, fevers, chills, night sweats, unintended weight changes. He denies any bowel disturbances, and denies abdominal pain, nausea or vomiting. He denies any new musculoskeletal or joint aches or pains. His IPSS was 8, indicating mild urinary symptoms. He reports urinary frequency and an intermittent urine stream, as well as nocturia x2. His SHIM was 22, indicating he has minimal erectile dysfunction. A complete review of systems is obtained and is otherwise negative.    PHYSICAL EXAM:  Wt Readings from Last 3 Encounters:  11/14/20 167 lb 3.2 oz (75.8 kg)  06/11/17 171 lb (77.6 kg)  05/28/17 171 lb 12.8 oz (77.9 kg)   Temp Readings from Last 3 Encounters:  11/14/20 97.8 F (36.6 C)  06/11/17 98.4 F (36.9 C)  06/01/12 97.3 F (36.3 C)   BP Readings from Last 3 Encounters:  11/14/20 (!) 192/100  06/11/17 122/71  06/01/12 (!) 183/79   Pulse Readings from Last 3 Encounters:  11/14/20 (!) 116  06/11/17 66  06/01/12 95   Pain Assessment Pain Score: 0-No pain/10  In general this is a well appearing Caucasian man in no acute distress. He's alert and oriented x4 and appropriate throughout the examination. Cardiopulmonary assessment is negative for acute distress, and he exhibits normal effort.     KPS = 90  100 - Normal; no complaints; no evidence of disease. 90   - Able to carry on normal activity; minor signs or symptoms of disease. 80   - Normal  activity with effort; some signs or symptoms of disease. 60   - Cares for self; unable to carry on normal activity or to do active work. 60   - Requires occasional assistance, but is able to care for most of his personal needs. 50   - Requires considerable assistance and frequent medical care. 6   - Disabled; requires special care and assistance. 39   - Severely disabled; hospital admission is indicated although death not imminent. 62   - Very sick; hospital admission necessary; active supportive treatment necessary. 10   - Moribund; fatal processes progressing rapidly. 0     - Dead  Karnofsky DA, Abelmann WH, Craver LS and [facility] [phone]) The use of the nitrogen mustards in the palliative treatment of carcinoma: with particular reference to bronchogenic carcinoma Cancer 1 634-56  LABORATORY DATA:  No results found for: WBC, HGB, HCT, MCV, PLT No results found for: NA, K, CL, CO2 No results found for: ALT, AST, GGT, ALKPHOS, BILITOT   RADIOGRAPHY: No results found.    IMPRESSION/PLAN: 1. 74 y.o. gentleman with Stage T1c adenocarcinoma of the prostate with Gleason Score of 4+5, and PSA of 6.8. We discussed the patient's workup and outlined the nature of prostate cancer in this setting with the patient and his significant other. The patient's T stage, Gleason's score, and PSA put him into the high risk group. Accordingly, he is eligible for a variety of potential treatment options including prostatectomy or LT-ADT in combination with either 8 weeks of external radiation or 5 weeks of external radiation with an upfront brachytherapy boost. We discussed the available radiation techniques, and focused on the details and logistics of delivery. The patient is not an ideal candidate for brachytherapy boost with a prostate volume of 67.64 and moderate LUTS. We discussed and outlined the risks, benefits, short and long-term effects associated with radiotherapy and compared and contrasted these with  prostatectomy. We discussed the role of SpaceOAR gel in reducing the rectal toxicity associated with radiotherapy. We also detailed the role of ADT in the treatment of high risk prostate cancer and outlined the associated side effects that could be expected with this therapy.  He and his significant other appear to have a good understanding of his disease and our treatment recommendations which are of curative intent. They were encouraged to ask questions that were answered to their stated satisfaction.  At the conclusion of our conversation, the patient is interested in moving forward with 8 weeks of external beam therapy in combination with LT-ADT. We will share our discussion with Dr. Abner Greenspan and make arrangements for start of ADT, first available.  The patient has a scheduled follow-up visit with Dr. Abner Greenspan on 11/21/2019 and is hopeful that he may be able to start the ADT at that time.  We will also coordinate for fiducial markers and SpaceOAR gel placement in early March 2022, prior to simulation, in anticipation of beginning IMRT approximately 2 months after starting ADT.  We enjoyed meeting the patient and his significant other today and look forward to continue to participate in his care.   Nicholos Johns, PA-C    Tyler Pita, MD  [facility] Direct Dial: [phone]  Fax: [phone] [email or web address]  Skype  [email or web address]   This document serves as a record of services personally performed by Tyler Pita, MD and Freeman Caldron, PA-C. It was created on their behalf by Wilburn Mylar, a trained medical scribe. The creation of this record is based on the scribe's personal observations and the provider's statements to them. This document has been checked and approved by the attending provider.

## 2020-11-14 NOTE — Progress Notes (Deleted)
                               Care Plan Summary  Name:  DOB:    Your Medical Team:   Urologist -  Dr. Lester Borden, [facility]  Radiation Oncologist - Dr. Matthew Manning, [facility]   Medical Oncologist - Dr. Firas Shadad, [facility]  Recommendations: 1)  2)  3)  * These recommendations are based on information available as of today's consult.      Recommendations may change depending on the results of further tests or exams.    Next Steps: 1)  2) 3)  When appointments need to be scheduled, you will be contacted by [facility] and/or [facility].  Questions?  Please do not hesitate to call Robin Corynne Scibilia, RN, BSN, OCN at [phone] any questions or concerns.  Robin is your Oncology Nurse Navigator and is available to assist you while you're receiving your medical care at [facility]. 

## 2020-11-16 ENCOUNTER — Telehealth: Payer: Self-pay | Admitting: *Deleted

## 2020-11-16 NOTE — Telephone Encounter (Signed)
Returned patient's phone call, spoke with patient 

## 2020-11-16 NOTE — Telephone Encounter (Signed)
CALLED PATIENT TO INFORM THAT ADT HAS TO BE PRE-AUTH THRU INSURANCE PER [facility], SPOKE WITH PATIENT AND HE VERIFIED UNDERSTANDING THIS

## 2020-11-20 DIAGNOSIS — R350 Frequency of micturition: Secondary | ICD-10-CM | POA: Diagnosis not present

## 2020-11-20 DIAGNOSIS — C61 Malignant neoplasm of prostate: Secondary | ICD-10-CM | POA: Diagnosis not present

## 2020-11-20 NOTE — Progress Notes (Signed)
Introduced myself to Terry Gay and his significant other, as the prostate nurse navigator and discuss my role. He has not family history of prostate cancer but other cancers. He is not interested in surgery and here to discuss his radiation treatment options.  No barriers to care identified at this time. I gave them my business card and asked him to call me with questions or concerns. He voiced understanding.

## 2020-11-27 ENCOUNTER — Encounter: Payer: Self-pay | Admitting: Medical Oncology

## 2020-11-27 DIAGNOSIS — C61 Malignant neoplasm of prostate: Secondary | ICD-10-CM | POA: Diagnosis not present

## 2020-11-27 DIAGNOSIS — Z5111 Encounter for antineoplastic chemotherapy: Secondary | ICD-10-CM | POA: Diagnosis not present

## 2020-12-25 DIAGNOSIS — Z5111 Encounter for antineoplastic chemotherapy: Secondary | ICD-10-CM | POA: Diagnosis not present

## 2020-12-25 DIAGNOSIS — C61 Malignant neoplasm of prostate: Secondary | ICD-10-CM | POA: Diagnosis not present

## 2021-01-02 ENCOUNTER — Telehealth: Payer: Medicare HMO | Admitting: *Deleted

## 2021-01-02 NOTE — Telephone Encounter (Signed)
CALLED PATIENT TO INFORM OF ADT INJ. FOR 01-09-21- ARRIVAL TIME- 10:15 AM @ [facility], SPOKE WITH PATIENT AND HE IS AWARE OF THIS APPT.

## 2021-01-17 ENCOUNTER — Encounter: Payer: Self-pay | Admitting: Urology

## 2021-01-17 NOTE — Progress Notes (Signed)
Scheduled for Fiducial markers and SpaceOAR gel 01/29/21 at [facility] and CT SIM on 02/09/21.

## 2021-01-30 ENCOUNTER — Ambulatory Visit: Payer: Self-pay | Admitting: Radiation Oncology

## 2021-02-01 DIAGNOSIS — C61 Malignant neoplasm of prostate: Secondary | ICD-10-CM | POA: Diagnosis not present

## 2021-02-08 ENCOUNTER — Telehealth: Payer: Self-pay | Admitting: *Deleted

## 2021-02-08 NOTE — Progress Notes (Signed)
  Radiation Oncology         [phone] ________________________________  Name: Terry Gay MRN: [id number]  Date: 02/09/2021  DOB: 10-07-1946  SIMULATION AND TREATMENT PLANNING NOTE    ICD-10-CM   1. Malignant neoplasm of prostate ([place])  C61     DIAGNOSIS:  75 y.o. gentleman with Stage T1c adenocarcinoma of the prostate with Gleason score of 4+5, and PSA of 6.8.  NARRATIVE:  The patient was brought to the [place].  Identity was confirmed.  All relevant records and images related to the planned course of therapy were reviewed.  The patient freely provided informed written consent to proceed with treatment after reviewing the details related to the planned course of therapy. The consent form was witnessed and verified by the simulation staff.  Then, the patient was set-up in a stable reproducible supine position for radiation therapy.  A vacuum lock pillow device was custom fabricated to position his legs in a reproducible immobilized position.  Then, I performed a urethrogram under sterile conditions to identify the prostatic bed.  CT images were obtained.  Surface markings were placed.  The CT images were loaded into the planning software.  Then the prostate bed target, pelvic lymph node target and avoidance structures including the rectum, bladder, bowel and hips were contoured.  Treatment planning then occurred.  The radiation prescription was entered and confirmed.  A total of one complex treatment devices were fabricated. I have requested : Intensity Modulated Radiotherapy (IMRT) is medically necessary for this case for the following reason:  Rectal sparing.Marland Kitchen  PLAN:  The patient will receive 45 Gy in 25 fractions of 1.8 Gy, followed by a boost to the prostate to a total dose of 75 Gy with 15 additional fractions of 2 Gy.   ________________________________  Sheral Apley Tammi Klippel, M.D.

## 2021-02-08 NOTE — Telephone Encounter (Signed)
Called patient to remind of sim appt. for 02-09-21- arrival time- 9:15 am @ [facility], spoke with patient and he is aware of this appt.

## 2021-02-09 ENCOUNTER — Other Ambulatory Visit: Payer: Self-pay

## 2021-02-09 ENCOUNTER — Ambulatory Visit
Admission: RE | Admit: 2021-02-09 | Discharge: 2021-02-09 | Disposition: A | Discharge status: Home / Self Care | Payer: Medicare HMO | Source: Ambulatory Visit | Attending: Radiation Oncology | Admitting: Radiation Oncology

## 2021-02-09 DIAGNOSIS — C61 Malignant neoplasm of prostate: Secondary | ICD-10-CM | POA: Diagnosis not present

## 2021-02-13 DIAGNOSIS — C61 Malignant neoplasm of prostate: Secondary | ICD-10-CM | POA: Diagnosis not present

## 2021-02-20 ENCOUNTER — Other Ambulatory Visit: Payer: Self-pay

## 2021-02-20 ENCOUNTER — Ambulatory Visit
Admission: RE | Admit: 2021-02-20 | Discharge: 2021-02-20 | Disposition: A | Discharge status: Home / Self Care | Payer: Medicare HMO | Source: Ambulatory Visit | Attending: Radiation Oncology | Admitting: Radiation Oncology

## 2021-02-20 DIAGNOSIS — C61 Malignant neoplasm of prostate: Secondary | ICD-10-CM | POA: Insufficient documentation

## 2021-02-21 ENCOUNTER — Ambulatory Visit
Admission: RE | Admit: 2021-02-21 | Discharge: 2021-02-21 | Disposition: A | Discharge status: Home / Self Care | Payer: Medicare HMO | Source: Ambulatory Visit | Attending: Radiation Oncology | Admitting: Radiation Oncology

## 2021-02-21 DIAGNOSIS — C61 Malignant neoplasm of prostate: Secondary | ICD-10-CM | POA: Diagnosis not present

## 2021-02-22 ENCOUNTER — Ambulatory Visit
Admission: RE | Admit: 2021-02-22 | Discharge: 2021-02-22 | Disposition: A | Discharge status: Home / Self Care | Payer: Medicare HMO | Source: Ambulatory Visit | Attending: Radiation Oncology | Admitting: Radiation Oncology

## 2021-02-22 DIAGNOSIS — C61 Malignant neoplasm of prostate: Secondary | ICD-10-CM | POA: Diagnosis not present

## 2021-02-23 ENCOUNTER — Ambulatory Visit
Admission: RE | Admit: 2021-02-23 | Discharge: 2021-02-23 | Disposition: A | Discharge status: Home / Self Care | Payer: Medicare HMO | Source: Ambulatory Visit | Attending: Radiation Oncology | Admitting: Radiation Oncology

## 2021-02-23 DIAGNOSIS — C61 Malignant neoplasm of prostate: Secondary | ICD-10-CM | POA: Diagnosis not present

## 2021-02-26 ENCOUNTER — Ambulatory Visit
Admission: RE | Admit: 2021-02-26 | Discharge: 2021-02-26 | Disposition: A | Discharge status: Home / Self Care | Payer: Medicare HMO | Source: Ambulatory Visit | Attending: Radiation Oncology | Admitting: Radiation Oncology

## 2021-02-26 ENCOUNTER — Other Ambulatory Visit: Payer: Self-pay

## 2021-02-26 DIAGNOSIS — C61 Malignant neoplasm of prostate: Secondary | ICD-10-CM | POA: Diagnosis not present

## 2021-02-27 ENCOUNTER — Ambulatory Visit
Admission: RE | Admit: 2021-02-27 | Discharge: 2021-02-27 | Disposition: A | Discharge status: Home / Self Care | Payer: Medicare HMO | Source: Ambulatory Visit | Attending: Radiation Oncology | Admitting: Radiation Oncology

## 2021-02-27 DIAGNOSIS — C61 Malignant neoplasm of prostate: Secondary | ICD-10-CM | POA: Diagnosis not present

## 2021-02-28 ENCOUNTER — Ambulatory Visit
Admission: RE | Admit: 2021-02-28 | Discharge: 2021-02-28 | Disposition: A | Discharge status: Home / Self Care | Payer: Medicare HMO | Source: Ambulatory Visit | Attending: Radiation Oncology | Admitting: Radiation Oncology

## 2021-02-28 ENCOUNTER — Other Ambulatory Visit: Payer: Self-pay

## 2021-02-28 DIAGNOSIS — C61 Malignant neoplasm of prostate: Secondary | ICD-10-CM | POA: Diagnosis not present

## 2021-03-01 ENCOUNTER — Ambulatory Visit
Admission: RE | Admit: 2021-03-01 | Discharge: 2021-03-01 | Disposition: A | Discharge status: Home / Self Care | Payer: Medicare HMO | Source: Ambulatory Visit | Attending: Radiation Oncology | Admitting: Radiation Oncology

## 2021-03-01 ENCOUNTER — Other Ambulatory Visit: Payer: Self-pay

## 2021-03-01 DIAGNOSIS — C61 Malignant neoplasm of prostate: Secondary | ICD-10-CM | POA: Diagnosis not present

## 2021-03-02 ENCOUNTER — Other Ambulatory Visit: Payer: Self-pay | Admitting: Radiation Oncology

## 2021-03-02 ENCOUNTER — Ambulatory Visit
Admission: RE | Admit: 2021-03-02 | Discharge: 2021-03-02 | Disposition: A | Discharge status: Home / Self Care | Payer: Medicare HMO | Source: Ambulatory Visit | Attending: Radiation Oncology | Admitting: Radiation Oncology

## 2021-03-02 DIAGNOSIS — C61 Malignant neoplasm of prostate: Secondary | ICD-10-CM | POA: Diagnosis not present

## 2021-03-02 MED ORDER — TAMSULOSIN HCL 0.4 MG PO CAPS: 0.4000 mg | ORAL_CAPSULE | Freq: Every day | ORAL | 5 refills | Status: AC

## 2021-03-05 ENCOUNTER — Other Ambulatory Visit: Payer: Self-pay

## 2021-03-05 ENCOUNTER — Ambulatory Visit
Admission: RE | Admit: 2021-03-05 | Discharge: 2021-03-05 | Disposition: A | Discharge status: Home / Self Care | Payer: Medicare HMO | Source: Ambulatory Visit | Attending: Radiation Oncology | Admitting: Radiation Oncology

## 2021-03-05 DIAGNOSIS — C61 Malignant neoplasm of prostate: Secondary | ICD-10-CM | POA: Diagnosis not present

## 2021-03-06 ENCOUNTER — Ambulatory Visit
Admission: RE | Admit: 2021-03-06 | Discharge: 2021-03-06 | Disposition: A | Discharge status: Home / Self Care | Payer: Medicare HMO | Source: Ambulatory Visit | Attending: Radiation Oncology | Admitting: Radiation Oncology

## 2021-03-06 DIAGNOSIS — C61 Malignant neoplasm of prostate: Secondary | ICD-10-CM | POA: Diagnosis not present

## 2021-03-07 ENCOUNTER — Ambulatory Visit
Admission: RE | Admit: 2021-03-07 | Discharge: 2021-03-07 | Disposition: A | Discharge status: Home / Self Care | Payer: Medicare HMO | Source: Ambulatory Visit | Attending: Radiation Oncology | Admitting: Radiation Oncology

## 2021-03-07 ENCOUNTER — Other Ambulatory Visit: Payer: Self-pay

## 2021-03-07 DIAGNOSIS — C61 Malignant neoplasm of prostate: Secondary | ICD-10-CM | POA: Diagnosis not present

## 2021-03-08 ENCOUNTER — Ambulatory Visit
Admission: RE | Admit: 2021-03-08 | Discharge: 2021-03-08 | Disposition: A | Discharge status: Home / Self Care | Payer: Medicare HMO | Source: Ambulatory Visit | Attending: Radiation Oncology | Admitting: Radiation Oncology

## 2021-03-08 DIAGNOSIS — C61 Malignant neoplasm of prostate: Secondary | ICD-10-CM | POA: Diagnosis not present

## 2021-03-09 ENCOUNTER — Ambulatory Visit
Admission: RE | Admit: 2021-03-09 | Discharge: 2021-03-09 | Disposition: A | Discharge status: Home / Self Care | Payer: Medicare HMO | Source: Ambulatory Visit | Attending: Radiation Oncology | Admitting: Radiation Oncology

## 2021-03-09 ENCOUNTER — Other Ambulatory Visit: Payer: Self-pay

## 2021-03-09 DIAGNOSIS — C61 Malignant neoplasm of prostate: Secondary | ICD-10-CM | POA: Diagnosis not present

## 2021-03-12 ENCOUNTER — Ambulatory Visit
Admission: RE | Admit: 2021-03-12 | Discharge: 2021-03-12 | Disposition: A | Discharge status: Home / Self Care | Payer: Medicare HMO | Source: Ambulatory Visit | Attending: Radiation Oncology | Admitting: Radiation Oncology

## 2021-03-12 DIAGNOSIS — C61 Malignant neoplasm of prostate: Secondary | ICD-10-CM | POA: Diagnosis not present

## 2021-03-13 ENCOUNTER — Other Ambulatory Visit: Payer: Self-pay

## 2021-03-13 ENCOUNTER — Ambulatory Visit
Admission: RE | Admit: 2021-03-13 | Discharge: 2021-03-13 | Disposition: A | Discharge status: Home / Self Care | Payer: Medicare HMO | Source: Ambulatory Visit | Attending: Radiation Oncology | Admitting: Radiation Oncology

## 2021-03-13 DIAGNOSIS — C61 Malignant neoplasm of prostate: Secondary | ICD-10-CM | POA: Diagnosis not present

## 2021-03-14 ENCOUNTER — Ambulatory Visit
Admission: RE | Admit: 2021-03-14 | Discharge: 2021-03-14 | Disposition: A | Discharge status: Home / Self Care | Payer: Medicare HMO | Source: Ambulatory Visit | Attending: Radiation Oncology | Admitting: Radiation Oncology

## 2021-03-14 DIAGNOSIS — C61 Malignant neoplasm of prostate: Secondary | ICD-10-CM | POA: Diagnosis not present

## 2021-03-15 ENCOUNTER — Ambulatory Visit
Admission: RE | Admit: 2021-03-15 | Discharge: 2021-03-15 | Disposition: A | Discharge status: Home / Self Care | Payer: Medicare HMO | Source: Ambulatory Visit | Attending: Radiation Oncology | Admitting: Radiation Oncology

## 2021-03-15 ENCOUNTER — Other Ambulatory Visit: Payer: Self-pay

## 2021-03-15 DIAGNOSIS — C61 Malignant neoplasm of prostate: Secondary | ICD-10-CM | POA: Diagnosis not present

## 2021-03-16 ENCOUNTER — Ambulatory Visit
Admission: RE | Admit: 2021-03-16 | Discharge: 2021-03-16 | Disposition: A | Discharge status: Home / Self Care | Payer: Medicare HMO | Source: Ambulatory Visit | Attending: Radiation Oncology | Admitting: Radiation Oncology

## 2021-03-16 DIAGNOSIS — C61 Malignant neoplasm of prostate: Secondary | ICD-10-CM | POA: Diagnosis not present

## 2021-03-19 ENCOUNTER — Ambulatory Visit
Admission: RE | Admit: 2021-03-19 | Discharge: 2021-03-19 | Disposition: A | Discharge status: Home / Self Care | Payer: Medicare HMO | Source: Ambulatory Visit | Attending: Radiation Oncology | Admitting: Radiation Oncology

## 2021-03-19 DIAGNOSIS — C61 Malignant neoplasm of prostate: Secondary | ICD-10-CM | POA: Insufficient documentation

## 2021-03-20 ENCOUNTER — Other Ambulatory Visit: Payer: Self-pay

## 2021-03-20 ENCOUNTER — Ambulatory Visit
Admission: RE | Admit: 2021-03-20 | Discharge: 2021-03-20 | Disposition: A | Discharge status: Home / Self Care | Payer: Medicare HMO | Source: Ambulatory Visit | Attending: Radiation Oncology | Admitting: Radiation Oncology

## 2021-03-20 DIAGNOSIS — C61 Malignant neoplasm of prostate: Secondary | ICD-10-CM | POA: Diagnosis not present

## 2021-03-21 ENCOUNTER — Ambulatory Visit
Admission: RE | Admit: 2021-03-21 | Discharge: 2021-03-21 | Disposition: A | Discharge status: Home / Self Care | Payer: Medicare HMO | Source: Ambulatory Visit | Attending: Radiation Oncology | Admitting: Radiation Oncology

## 2021-03-21 DIAGNOSIS — C61 Malignant neoplasm of prostate: Secondary | ICD-10-CM | POA: Diagnosis not present

## 2021-03-22 ENCOUNTER — Ambulatory Visit
Admission: RE | Admit: 2021-03-22 | Discharge: 2021-03-22 | Disposition: A | Discharge status: Home / Self Care | Payer: Medicare HMO | Source: Ambulatory Visit | Attending: Radiation Oncology | Admitting: Radiation Oncology

## 2021-03-22 DIAGNOSIS — C61 Malignant neoplasm of prostate: Secondary | ICD-10-CM | POA: Diagnosis not present

## 2021-03-23 ENCOUNTER — Ambulatory Visit
Admission: RE | Admit: 2021-03-23 | Discharge: 2021-03-23 | Disposition: A | Discharge status: Home / Self Care | Payer: Medicare HMO | Source: Ambulatory Visit | Attending: Radiation Oncology | Admitting: Radiation Oncology

## 2021-03-23 ENCOUNTER — Other Ambulatory Visit: Payer: Self-pay

## 2021-03-23 DIAGNOSIS — C61 Malignant neoplasm of prostate: Secondary | ICD-10-CM | POA: Diagnosis not present

## 2021-03-26 ENCOUNTER — Ambulatory Visit
Admission: RE | Admit: 2021-03-26 | Discharge: 2021-03-26 | Disposition: A | Discharge status: Home / Self Care | Payer: Medicare HMO | Source: Ambulatory Visit | Attending: Radiation Oncology | Admitting: Radiation Oncology

## 2021-03-26 ENCOUNTER — Other Ambulatory Visit: Payer: Self-pay

## 2021-03-26 DIAGNOSIS — C61 Malignant neoplasm of prostate: Secondary | ICD-10-CM | POA: Diagnosis not present

## 2021-03-27 ENCOUNTER — Ambulatory Visit
Admission: RE | Admit: 2021-03-27 | Discharge: 2021-03-27 | Disposition: A | Discharge status: Home / Self Care | Payer: Medicare HMO | Source: Ambulatory Visit | Attending: Radiation Oncology | Admitting: Radiation Oncology

## 2021-03-27 DIAGNOSIS — C61 Malignant neoplasm of prostate: Secondary | ICD-10-CM | POA: Diagnosis not present

## 2021-03-28 ENCOUNTER — Ambulatory Visit
Admission: RE | Admit: 2021-03-28 | Discharge: 2021-03-28 | Disposition: A | Discharge status: Home / Self Care | Payer: Medicare HMO | Source: Ambulatory Visit | Attending: Radiation Oncology | Admitting: Radiation Oncology

## 2021-03-28 ENCOUNTER — Other Ambulatory Visit: Payer: Self-pay

## 2021-03-28 DIAGNOSIS — C61 Malignant neoplasm of prostate: Secondary | ICD-10-CM | POA: Diagnosis not present

## 2021-03-29 ENCOUNTER — Ambulatory Visit
Admission: RE | Admit: 2021-03-29 | Discharge: 2021-03-29 | Disposition: A | Discharge status: Home / Self Care | Payer: Medicare HMO | Source: Ambulatory Visit | Attending: Radiation Oncology | Admitting: Radiation Oncology

## 2021-03-29 DIAGNOSIS — C61 Malignant neoplasm of prostate: Secondary | ICD-10-CM | POA: Diagnosis not present

## 2021-03-30 ENCOUNTER — Ambulatory Visit
Admission: RE | Admit: 2021-03-30 | Discharge: 2021-03-30 | Disposition: A | Discharge status: Home / Self Care | Payer: Medicare HMO | Source: Ambulatory Visit | Attending: Radiation Oncology | Admitting: Radiation Oncology

## 2021-03-30 DIAGNOSIS — C61 Malignant neoplasm of prostate: Secondary | ICD-10-CM | POA: Diagnosis not present

## 2021-04-02 ENCOUNTER — Ambulatory Visit
Admission: RE | Admit: 2021-04-02 | Discharge: 2021-04-02 | Disposition: A | Discharge status: Home / Self Care | Payer: Medicare HMO | Source: Ambulatory Visit | Attending: Radiation Oncology | Admitting: Radiation Oncology

## 2021-04-02 DIAGNOSIS — C61 Malignant neoplasm of prostate: Secondary | ICD-10-CM | POA: Diagnosis not present

## 2021-04-03 ENCOUNTER — Ambulatory Visit
Admission: RE | Admit: 2021-04-03 | Discharge: 2021-04-03 | Disposition: A | Discharge status: Home / Self Care | Payer: Medicare HMO | Source: Ambulatory Visit | Attending: Radiation Oncology | Admitting: Radiation Oncology

## 2021-04-03 ENCOUNTER — Other Ambulatory Visit: Payer: Self-pay

## 2021-04-03 DIAGNOSIS — C61 Malignant neoplasm of prostate: Secondary | ICD-10-CM | POA: Diagnosis not present

## 2021-04-04 ENCOUNTER — Ambulatory Visit
Admission: RE | Admit: 2021-04-04 | Discharge: 2021-04-04 | Disposition: A | Discharge status: Home / Self Care | Payer: Medicare HMO | Source: Ambulatory Visit | Attending: Radiation Oncology | Admitting: Radiation Oncology

## 2021-04-04 DIAGNOSIS — C61 Malignant neoplasm of prostate: Secondary | ICD-10-CM | POA: Diagnosis not present

## 2021-04-05 ENCOUNTER — Ambulatory Visit
Admission: RE | Admit: 2021-04-05 | Discharge: 2021-04-05 | Disposition: A | Discharge status: Home / Self Care | Payer: Medicare HMO | Source: Ambulatory Visit | Attending: Radiation Oncology | Admitting: Radiation Oncology

## 2021-04-05 DIAGNOSIS — C61 Malignant neoplasm of prostate: Secondary | ICD-10-CM | POA: Diagnosis not present

## 2021-04-06 ENCOUNTER — Other Ambulatory Visit: Payer: Self-pay

## 2021-04-06 ENCOUNTER — Ambulatory Visit
Admission: RE | Admit: 2021-04-06 | Discharge: 2021-04-06 | Disposition: A | Discharge status: Home / Self Care | Payer: Medicare HMO | Source: Ambulatory Visit | Attending: Radiation Oncology | Admitting: Radiation Oncology

## 2021-04-06 DIAGNOSIS — C61 Malignant neoplasm of prostate: Secondary | ICD-10-CM | POA: Diagnosis not present

## 2021-04-09 ENCOUNTER — Ambulatory Visit
Admission: RE | Admit: 2021-04-09 | Discharge: 2021-04-09 | Disposition: A | Discharge status: Home / Self Care | Payer: Medicare HMO | Source: Ambulatory Visit | Attending: Radiation Oncology | Admitting: Radiation Oncology

## 2021-04-09 ENCOUNTER — Other Ambulatory Visit: Payer: Self-pay

## 2021-04-09 DIAGNOSIS — C61 Malignant neoplasm of prostate: Secondary | ICD-10-CM | POA: Diagnosis not present

## 2021-04-10 ENCOUNTER — Ambulatory Visit
Admission: RE | Admit: 2021-04-10 | Discharge: 2021-04-10 | Disposition: A | Discharge status: Home / Self Care | Payer: Medicare HMO | Source: Ambulatory Visit | Attending: Radiation Oncology | Admitting: Radiation Oncology

## 2021-04-10 DIAGNOSIS — C61 Malignant neoplasm of prostate: Secondary | ICD-10-CM | POA: Diagnosis not present

## 2021-04-11 ENCOUNTER — Ambulatory Visit
Admission: RE | Admit: 2021-04-11 | Discharge: 2021-04-11 | Disposition: A | Discharge status: Home / Self Care | Payer: Medicare HMO | Source: Ambulatory Visit | Attending: Radiation Oncology | Admitting: Radiation Oncology

## 2021-04-11 DIAGNOSIS — C61 Malignant neoplasm of prostate: Secondary | ICD-10-CM | POA: Diagnosis not present

## 2021-04-12 ENCOUNTER — Ambulatory Visit
Admission: RE | Admit: 2021-04-12 | Discharge: 2021-04-12 | Disposition: A | Discharge status: Home / Self Care | Payer: Medicare HMO | Source: Ambulatory Visit | Attending: Radiation Oncology | Admitting: Radiation Oncology

## 2021-04-12 ENCOUNTER — Other Ambulatory Visit: Payer: Self-pay

## 2021-04-12 DIAGNOSIS — C61 Malignant neoplasm of prostate: Secondary | ICD-10-CM | POA: Diagnosis not present

## 2021-04-13 ENCOUNTER — Ambulatory Visit
Admission: RE | Admit: 2021-04-13 | Discharge: 2021-04-13 | Disposition: A | Discharge status: Home / Self Care | Payer: Medicare HMO | Source: Ambulatory Visit | Attending: Radiation Oncology | Admitting: Radiation Oncology

## 2021-04-13 DIAGNOSIS — C61 Malignant neoplasm of prostate: Secondary | ICD-10-CM | POA: Diagnosis not present

## 2021-04-17 ENCOUNTER — Ambulatory Visit
Admission: RE | Admit: 2021-04-17 | Discharge: 2021-04-17 | Disposition: A | Discharge status: Home / Self Care | Payer: Medicare HMO | Source: Ambulatory Visit | Attending: Radiation Oncology | Admitting: Radiation Oncology

## 2021-04-17 ENCOUNTER — Encounter: Payer: Self-pay | Admitting: Urology

## 2021-04-17 DIAGNOSIS — C61 Malignant neoplasm of prostate: Secondary | ICD-10-CM | POA: Diagnosis not present

## 2021-04-24 DIAGNOSIS — C61 Malignant neoplasm of prostate: Secondary | ICD-10-CM | POA: Diagnosis not present

## 2021-05-02 DIAGNOSIS — C61 Malignant neoplasm of prostate: Secondary | ICD-10-CM | POA: Diagnosis not present

## 2021-05-02 DIAGNOSIS — R351 Nocturia: Secondary | ICD-10-CM | POA: Diagnosis not present

## 2021-05-02 DIAGNOSIS — R350 Frequency of micturition: Secondary | ICD-10-CM | POA: Diagnosis not present

## 2021-05-03 DIAGNOSIS — H2513 Age-related nuclear cataract, bilateral: Secondary | ICD-10-CM | POA: Diagnosis not present

## 2021-05-03 DIAGNOSIS — H35373 Puckering of macula, bilateral: Secondary | ICD-10-CM | POA: Diagnosis not present

## 2021-05-03 DIAGNOSIS — H52203 Unspecified astigmatism, bilateral: Secondary | ICD-10-CM | POA: Diagnosis not present

## 2021-05-29 ENCOUNTER — Telehealth: Payer: Self-pay | Admitting: *Deleted

## 2021-05-29 NOTE — Telephone Encounter (Signed)
CALLED PATIENT TO INFORM THAT PROVIDER IS OUT SICK TODAY AND THAT SHE WILL CALL HIM TOMORROW @ 3:30 PM, SPOKE WITH PATIENT AND HE VERIFIED UNDERSTANDING THIS

## 2021-05-30 ENCOUNTER — Ambulatory Visit
Admission: RE | Admit: 2021-05-30 | Discharge: 2021-05-30 | Disposition: A | Discharge status: Home / Self Care | Payer: Medicare HMO | Source: Ambulatory Visit | Attending: Urology | Admitting: Urology

## 2021-05-30 ENCOUNTER — Other Ambulatory Visit: Payer: Self-pay

## 2021-05-30 DIAGNOSIS — C61 Malignant neoplasm of prostate: Secondary | ICD-10-CM

## 2021-05-30 NOTE — Progress Notes (Signed)
Radiation Oncology         [phone] ________________________________  Name: Terry Gay MRN: [id number]  Date: 05/30/2021  DOB: Oct 19, 1946  Post Treatment Note  CC: Tisovec, Fransico Him, MD  Tisovec, Fransico Him, MD  Diagnosis:   75 y.o. gentleman with Stage T1c adenocarcinoma of the prostate with Gleason score of 4+5, and PSA of 6.8.     Interval Since Last Radiation:  6 weeks, concurrent with ADT (started 01/09/21) 02/20/21 - 04/17/21: 1. The prostate, seminal vesicles, and pelvic lymph nodes were initially treated to 45 Gy in 25 fractions of 1.8 Gy 2. The prostate only was boosted to 75 Gy with 15 additional fractions of 2.0 Gy  Narrative:  I spoke with the patient to conduct his routine scheduled 1 month follow up visit via telephone to spare the patient unnecessary potential exposure in the healthcare setting during the current COVID-19 pandemic.  The patient was notified in advance and gave permission to proceed with this visit format.  He tolerated radiation treatment relatively well with only minor urinary irritation and modest fatigue.  He did experience increased urgency, frequency, dysuria, nocturia 4-5 times per night and hesitancy despite taking Flomax daily.  He also reported diarrhea improved with Imodium as needed.                              On review of systems, the patient states he is doing well in general.  He continues with nocturia 4-5 times per night but denies incontinence or incomplete bladder emptying.  He has some urgency and increased frequency but reports a strong stream throughout the day and denies straining to empty.  He denies abdominal pain, nausea, vomiting, diarrhea or constipation.  He has continued to tolerate the ADT fairly well despite fatigue and occasional hot flashes.  Overall, he is pleased with his progress to date.  ALLERGIES:  has No Known Allergies.  Meds: Current Outpatient Medications  Medication Sig Dispense Refill   aspirin 325 MG  tablet Take 325 mg by mouth every 6 (six) hours as needed. pain     naproxen sodium (ALEVE) 220 MG tablet Take 220 mg by mouth.     tamsulosin (FLOMAX) 0.4 MG CAPS capsule Take 1 capsule (0.4 mg total) by mouth daily after supper. 30 capsule 5   cetirizine (ZYRTEC) 10 MG chewable tablet Chew 10 mg by mouth daily. (Patient not taking: No sig reported)     doxycycline (VIBRAMYCIN) 100 MG capsule Take 100 mg by mouth 2 (two) times daily. (Patient not taking: No sig reported)     No current facility-administered medications for this encounter.    Physical Findings:  vitals were not taken for this visit.   /10 Unable to assess due to telephone follow-up visit format.  Lab Findings: No results found for: WBC, HGB, HCT, MCV, PLT   Radiographic Findings: No results found.  Impression/Plan: 1. 75 y.o. gentleman with Stage T1c adenocarcinoma of the prostate with Gleason score of 4+5, and PSA of 6.8.    He will continue to follow up with urology for ongoing PSA determinations and has an appointment scheduled with Dr. Curly Rim on 06/26/21. He understands what to expect with regards to PSA monitoring going forward. I will look forward to following his response to treatment via correspondence with urology, and would be happy to continue to participate in his care if clinically indicated. I talked to the patient about what to  expect in the future, including his risk for erectile dysfunction and rectal bleeding. I encouraged him to call or return to the office if he has any questions regarding his previous radiation or possible radiation side effects. He was comfortable with this plan and will follow up as needed.     Nicholos Johns, PA-C

## 2021-05-30 NOTE — Progress Notes (Signed)
  Radiation Oncology         [phone] ________________________________  Name: Terry Gay MRN: [id number]  Date: 04/17/2021  DOB: 01/14/1946  End of Treatment Note  Diagnosis:   75 y.o. gentleman with Stage T1c adenocarcinoma of the prostate with Gleason score of 4+5, and PSA of 6.8.     Indication for treatment:  Curative, Definitive Radiotherapy       Radiation treatment dates:   02/20/21 - 04/17/21  Site/dose:  1. The prostate, seminal vesicles, and pelvic lymph nodes were initially treated to 45 Gy in 25 fractions of 1.8 Gy  2. The prostate only was boosted to 75 Gy with 15 additional fractions of 2.0 Gy   Beams/energy:  1. The prostate, seminal vesicles, and pelvic lymph nodes were initially treated using VMAT intensity modulated radiotherapy delivering 6 megavolt photons. Image guidance was performed with CB-CT studies prior to each fraction. He was immobilized with a body fix lower extremity mold.  2. the prostate only was boosted using VMAT intensity modulated radiotherapy delivering 6 megavolt photons. Image guidance was performed with CB-CT studies prior to each fraction. He was immobilized with a body fix lower extremity mold.  Narrative: The patient tolerated radiation treatment relatively well with only minor urinary irritation and modest fatigue.  He did experience increased urgency, frequency, dysuria, nocturia 4-5 times per night and hesitancy despite taking Flomax daily.  He also reported diarrhea improved with Imodium as needed.  Plan: The patient has completed radiation treatment. He will return to radiation oncology clinic for routine followup in one month. I advised him to call or return sooner if he has any questions or concerns related to his recovery or treatment. ________________________________  Sheral Apley. Tammi Klippel, M.D.

## 2021-05-30 NOTE — Progress Notes (Addendum)
Patient is aware that this is a phone visit. Patient meaningful use is complete. Patient denies any dysuria or hematuria. Patient reports nocturia 4-5.Patient denies any leakage . Patient reports some urgency. Patient states that he empties his bladder with urination.Patient reports a strong stream during the day and a moderate stream at night.Patient denies having to strain to start stream. Patient reports that he void swithout having to stop.Patient denies any issues with his bowels.Patient next appointment with his urologist is 06/26/21.Patient I[place] is a 7

## 2021-06-26 DIAGNOSIS — C61 Malignant neoplasm of prostate: Secondary | ICD-10-CM | POA: Diagnosis not present

## 2021-07-03 DIAGNOSIS — Z5111 Encounter for antineoplastic chemotherapy: Secondary | ICD-10-CM | POA: Diagnosis not present

## 2021-07-03 DIAGNOSIS — R351 Nocturia: Secondary | ICD-10-CM | POA: Diagnosis not present

## 2021-07-03 DIAGNOSIS — C61 Malignant neoplasm of prostate: Secondary | ICD-10-CM | POA: Diagnosis not present

## 2021-07-03 DIAGNOSIS — R350 Frequency of micturition: Secondary | ICD-10-CM | POA: Diagnosis not present

## 2021-07-24 DIAGNOSIS — Z125 Encounter for screening for malignant neoplasm of prostate: Secondary | ICD-10-CM | POA: Diagnosis not present

## 2021-07-24 DIAGNOSIS — E7800 Pure hypercholesterolemia, unspecified: Secondary | ICD-10-CM | POA: Diagnosis not present

## 2021-07-25 DIAGNOSIS — M25511 Pain in right shoulder: Secondary | ICD-10-CM | POA: Diagnosis not present

## 2021-07-31 DIAGNOSIS — Z1212 Encounter for screening for malignant neoplasm of rectum: Secondary | ICD-10-CM | POA: Diagnosis not present

## 2021-07-31 DIAGNOSIS — I444 Left anterior fascicular block: Secondary | ICD-10-CM | POA: Diagnosis not present

## 2021-07-31 DIAGNOSIS — Z0000 Encounter for general adult medical examination without abnormal findings: Secondary | ICD-10-CM | POA: Diagnosis not present

## 2021-07-31 DIAGNOSIS — R69 Illness, unspecified: Secondary | ICD-10-CM | POA: Diagnosis not present

## 2021-07-31 DIAGNOSIS — R82998 Other abnormal findings in urine: Secondary | ICD-10-CM | POA: Diagnosis not present

## 2021-07-31 DIAGNOSIS — K5730 Diverticulosis of large intestine without perforation or abscess without bleeding: Secondary | ICD-10-CM | POA: Diagnosis not present

## 2021-07-31 DIAGNOSIS — D692 Other nonthrombocytopenic purpura: Secondary | ICD-10-CM | POA: Diagnosis not present

## 2021-07-31 DIAGNOSIS — Z23 Encounter for immunization: Secondary | ICD-10-CM | POA: Diagnosis not present

## 2021-07-31 DIAGNOSIS — D126 Benign neoplasm of colon, unspecified: Secondary | ICD-10-CM | POA: Diagnosis not present

## 2021-07-31 DIAGNOSIS — I10 Essential (primary) hypertension: Secondary | ICD-10-CM | POA: Diagnosis not present

## 2021-07-31 DIAGNOSIS — C61 Malignant neoplasm of prostate: Secondary | ICD-10-CM | POA: Diagnosis not present

## 2021-07-31 DIAGNOSIS — E7800 Pure hypercholesterolemia, unspecified: Secondary | ICD-10-CM | POA: Diagnosis not present

## 2021-07-31 DIAGNOSIS — L719 Rosacea, unspecified: Secondary | ICD-10-CM | POA: Diagnosis not present

## 2021-08-28 DIAGNOSIS — D225 Melanocytic nevi of trunk: Secondary | ICD-10-CM | POA: Diagnosis not present

## 2021-08-28 DIAGNOSIS — L812 Freckles: Secondary | ICD-10-CM | POA: Diagnosis not present

## 2021-08-28 DIAGNOSIS — L821 Other seborrheic keratosis: Secondary | ICD-10-CM | POA: Diagnosis not present

## 2021-08-28 DIAGNOSIS — D1801 Hemangioma of skin and subcutaneous tissue: Secondary | ICD-10-CM | POA: Diagnosis not present

## 2021-09-05 DIAGNOSIS — M25511 Pain in right shoulder: Secondary | ICD-10-CM | POA: Diagnosis not present

## 2021-12-28 DIAGNOSIS — C61 Malignant neoplasm of prostate: Secondary | ICD-10-CM | POA: Diagnosis not present

## 2022-01-03 DIAGNOSIS — R351 Nocturia: Secondary | ICD-10-CM | POA: Diagnosis not present

## 2022-01-03 DIAGNOSIS — R350 Frequency of micturition: Secondary | ICD-10-CM | POA: Diagnosis not present

## 2022-01-03 DIAGNOSIS — C61 Malignant neoplasm of prostate: Secondary | ICD-10-CM | POA: Diagnosis not present

## 2022-02-06 DIAGNOSIS — H1131 Conjunctival hemorrhage, right eye: Secondary | ICD-10-CM | POA: Diagnosis not present

## 2022-04-25 ENCOUNTER — Encounter: Payer: Self-pay | Admitting: Internal Medicine

## 2022-05-23 ENCOUNTER — Ambulatory Visit (AMBULATORY_SURGERY_CENTER): Payer: Self-pay | Admitting: *Deleted

## 2022-05-23 VITALS — Ht 65.0 in | Wt 166.0 lb

## 2022-05-23 DIAGNOSIS — Z86010 Personal history of colonic polyps: Secondary | ICD-10-CM

## 2022-05-23 MED ORDER — NA SULFATE-K SULFATE-MG SULF 17.5-3.13-1.6 GM/177ML PO SOLN: 1.0000 | ORAL | 0 refills | Status: DC

## 2022-05-23 NOTE — Progress Notes (Signed)
Patient is here in-person for PV. Patient denies any allergies to eggs or soy. Patient denies any problems with anesthesia/sedation. Patient is not on any oxygen at home. Patient is not taking any diet/weight loss medications or blood thinners. Went over procedure prep instructions with the patient. Patient is aware of our care-partner policy. Patient notified to use Good-Rx coupon given for prescription.   EMMI education assigned to the patient for the procedure, sent to [place].

## 2022-06-06 ENCOUNTER — Encounter: Payer: Self-pay | Admitting: Internal Medicine

## 2022-06-12 ENCOUNTER — Ambulatory Visit (AMBULATORY_SURGERY_CENTER): Payer: Medicare HMO | Admitting: Internal Medicine

## 2022-06-12 ENCOUNTER — Encounter: Payer: Self-pay | Admitting: Internal Medicine

## 2022-06-12 VITALS — BP 133/78 | HR 55 | Temp 97.7°F | Resp 12 | Ht 65.0 in | Wt 166.0 lb

## 2022-06-12 DIAGNOSIS — D123 Benign neoplasm of transverse colon: Secondary | ICD-10-CM

## 2022-06-12 DIAGNOSIS — Z860100 Personal history of colon polyps, unspecified: Secondary | ICD-10-CM

## 2022-06-12 DIAGNOSIS — Z09 Encounter for follow-up examination after completed treatment for conditions other than malignant neoplasm: Secondary | ICD-10-CM | POA: Diagnosis not present

## 2022-06-12 MED ORDER — SODIUM CHLORIDE 0.9 % IV SOLN: 500.0000 mL | Freq: Once | INTRAVENOUS | Status: DC

## 2022-06-12 NOTE — Progress Notes (Signed)
Sedate, gd SR, tolerated procedure well, VSS, report to RN 

## 2022-06-12 NOTE — Op Note (Signed)
[facility] Patient Name: Terry Gay Procedure Date: 06/12/2022 9:36 AM MRN: [id number] Endoscopist: Docia Chuck. Henrene Pastor , MD Age: 76 Referring MD:  Date of Birth: 06/29/1946 Gender: Male Account #: [id number] Procedure:                Colonoscopy with cold snare polypectomy x 1 Indications:              High risk colon cancer surveillance: Personal                            history of multiple (3 or more) adenomas. Previous                            examinations 2003, 2004, 2007, 2010, 2013, 2018 Medicines:                Monitored Anesthesia Care Procedure:                Pre-Anesthesia Assessment:                           - Prior to the procedure, a History and Physical                            was performed, and patient medications and                            allergies were reviewed. The patient's tolerance of                            previous anesthesia was also reviewed. The risks                            and benefits of the procedure and the sedation                            options and risks were discussed with the patient.                            All questions were answered, and informed consent                            was obtained. Prior Anticoagulants: The patient has                            taken no previous anticoagulant or antiplatelet                            agents. ASA Grade Assessment: II - A patient with                            mild systemic disease. After reviewing the risks                            and benefits, the patient was deemed in  satisfactory condition to undergo the procedure.                           After obtaining informed consent, the colonoscope                            was passed under direct vision. Throughout the                            procedure, the patient's blood pressure, pulse, and                            oxygen saturations were monitored continuously. The                             Olympus Colonoscope [id number] was introduced through                            the anus and advanced to the the cecum, identified                            by appendiceal orifice and ileocecal valve. The                            ileocecal valve, appendiceal orifice, and rectum                            were photographed. The quality of the bowel                            preparation was excellent. The colonoscopy was                            performed without difficulty. The patient tolerated                            the procedure well. The bowel preparation used was                            SUPREP via split dose instruction. Scope In: 9:40:55 AM Scope Out: 9:55:04 AM Scope Withdrawal Time: 0 hours 10 minutes 35 seconds  Total Procedure Duration: 0 hours 14 minutes 9 seconds  Findings:                 A 4 mm polyp was found in the transverse colon. The                            polyp was sessile. The polyp was removed with a                            cold snare. Resection and retrieval were complete.                           Multiple diverticula were found in  the sigmoid                            colon and right colon.                           Internal hemorrhoids were found during                            retroflexion. The hemorrhoids were moderate. 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                           The exam was otherwise without abnormality on                            direct and retroflexion views. Complications:            No immediate complications. Estimated blood loss:                            None. Estimated Blood Loss:     Estimated blood loss: none. Impression:               - One 4 mm polyp in the transverse colon, removed                            with a cold snare. Resected and retrieved.                           - Diverticulosis in the sigmoid colon and in the                             right colon. Mild nonbleeding radiation proctitis.                           - Internal hemorrhoids.                           - The examination was otherwise normal on direct                            and retroflexion views. Recommendation:           - Repeat colonoscopy is not recommended for                            surveillance.                           - Patient has a contact number available for                            emergencies. The signs and symptoms of potential  delayed complications were discussed with the                            patient. Return to normal activities tomorrow.                            Written discharge instructions were provided to the                            patient.                           - Resume previous diet.                           - Continue present medications.                           - Await pathology results. Docia Chuck. Henrene Pastor, MD 06/12/2022 10:02:30 AM This report has been signed electronically.

## 2022-06-12 NOTE — Patient Instructions (Signed)

## 2022-06-12 NOTE — Progress Notes (Signed)
Called to room to assist during endoscopic procedure.  Patient ID and intended procedure confirmed with present staff. Received instructions for my participation in the procedure from the performing physician.  

## 2022-06-12 NOTE — Progress Notes (Signed)
Pt's states no medical or surgical changes since previsit or office visit. 

## 2022-06-12 NOTE — Progress Notes (Signed)
HISTORY OF PRESENT ILLNESS:  Terry Gay is a 76 y.o. male with a history of multiple adenomatous colon polyps.  He presents today for surveillance colonoscopy.  Previous examinations 2003, 2004, 2007, 2010, 2013, 2018.  No active complaints  REVIEW OF SYSTEMS:  All non-GI ROS negative. Past Medical History:  Diagnosis Date   Colon polyps    Diverticulitis    Diverticulosis    Prostate cancer ([facility]) 2021   injections every 6 months    Past Surgical History:  Procedure Laterality Date   COLONOSCOPY  2013   COLONOSCOPY WITH PROPOFOL  06/11/2017   Dr.Tracker Mance   FRACTURE SURGERY  1958   right leg   PROSTATE BIOPSY      Social History AKONI PARTON  reports that he has never smoked. He has never used smokeless tobacco. He reports current alcohol use of about 10.0 standard drinks of alcohol per week. He reports that he does not use drugs.  family history includes Colon cancer (age of onset: 45) in his brother; Esophageal cancer in his mother.  No Known Allergies     PHYSICAL EXAMINATION: Vital signs: BP (!) 197/92   Pulse 71   Temp 97.7 F (36.5 C)   Ht '5\' 5"'$  (1.651 m)   Wt 166 lb (75.3 kg)   SpO2 98%   BMI 27.62 kg/m  General: Well-developed, well-nourished, no acute distress HEENT: Sclerae are anicteric, conjunctiva pink. Oral mucosa intact Lungs: Clear Heart: Regular Abdomen: soft, nontender, nondistended, no obvious ascites, no peritoneal signs, normal bowel sounds. No organomegaly. Extremities: No edema Psychiatric: alert and oriented x3. Cooperative      ASSESSMENT:   History multiple adenomatous colon polyps.  PLAN:   Surveillance colonoscopy

## 2022-06-13 ENCOUNTER — Telehealth: Payer: Self-pay | Admitting: *Deleted

## 2022-06-13 NOTE — Telephone Encounter (Signed)
Attempt for follow up phone call. No answer at number given.  Left message on voicemail.   

## 2022-06-14 ENCOUNTER — Encounter: Payer: Self-pay | Admitting: Internal Medicine

## 2022-06-24 DIAGNOSIS — C61 Malignant neoplasm of prostate: Secondary | ICD-10-CM | POA: Diagnosis not present

## 2022-07-04 DIAGNOSIS — C61 Malignant neoplasm of prostate: Secondary | ICD-10-CM | POA: Diagnosis not present

## 2022-07-04 DIAGNOSIS — R351 Nocturia: Secondary | ICD-10-CM | POA: Diagnosis not present

## 2022-07-29 DIAGNOSIS — R7989 Other specified abnormal findings of blood chemistry: Secondary | ICD-10-CM | POA: Diagnosis not present

## 2022-07-29 DIAGNOSIS — E7800 Pure hypercholesterolemia, unspecified: Secondary | ICD-10-CM | POA: Diagnosis not present

## 2022-07-29 DIAGNOSIS — I10 Essential (primary) hypertension: Secondary | ICD-10-CM | POA: Diagnosis not present

## 2022-07-29 DIAGNOSIS — Z125 Encounter for screening for malignant neoplasm of prostate: Secondary | ICD-10-CM | POA: Diagnosis not present

## 2022-08-07 DIAGNOSIS — K5730 Diverticulosis of large intestine without perforation or abscess without bleeding: Secondary | ICD-10-CM | POA: Diagnosis not present

## 2022-08-07 DIAGNOSIS — C61 Malignant neoplasm of prostate: Secondary | ICD-10-CM | POA: Diagnosis not present

## 2022-08-07 DIAGNOSIS — E7800 Pure hypercholesterolemia, unspecified: Secondary | ICD-10-CM | POA: Diagnosis not present

## 2022-08-07 DIAGNOSIS — R82998 Other abnormal findings in urine: Secondary | ICD-10-CM | POA: Diagnosis not present

## 2022-08-07 DIAGNOSIS — F5221 Male erectile disorder: Secondary | ICD-10-CM | POA: Diagnosis not present

## 2022-08-07 DIAGNOSIS — I444 Left anterior fascicular block: Secondary | ICD-10-CM | POA: Diagnosis not present

## 2022-08-07 DIAGNOSIS — R69 Illness, unspecified: Secondary | ICD-10-CM | POA: Diagnosis not present

## 2022-08-07 DIAGNOSIS — D126 Benign neoplasm of colon, unspecified: Secondary | ICD-10-CM | POA: Diagnosis not present

## 2022-08-07 DIAGNOSIS — Z0000 Encounter for general adult medical examination without abnormal findings: Secondary | ICD-10-CM | POA: Diagnosis not present

## 2022-08-07 DIAGNOSIS — D72819 Decreased white blood cell count, unspecified: Secondary | ICD-10-CM | POA: Diagnosis not present

## 2022-08-07 DIAGNOSIS — Z1331 Encounter for screening for depression: Secondary | ICD-10-CM | POA: Diagnosis not present

## 2022-08-07 DIAGNOSIS — I10 Essential (primary) hypertension: Secondary | ICD-10-CM | POA: Diagnosis not present

## 2022-08-07 DIAGNOSIS — D692 Other nonthrombocytopenic purpura: Secondary | ICD-10-CM | POA: Diagnosis not present

## 2022-08-07 DIAGNOSIS — Z1389 Encounter for screening for other disorder: Secondary | ICD-10-CM | POA: Diagnosis not present

## 2022-08-21 DIAGNOSIS — H2513 Age-related nuclear cataract, bilateral: Secondary | ICD-10-CM | POA: Diagnosis not present

## 2022-08-21 DIAGNOSIS — H52203 Unspecified astigmatism, bilateral: Secondary | ICD-10-CM | POA: Diagnosis not present

## 2022-09-16 DIAGNOSIS — L820 Inflamed seborrheic keratosis: Secondary | ICD-10-CM | POA: Diagnosis not present

## 2022-09-16 DIAGNOSIS — L812 Freckles: Secondary | ICD-10-CM | POA: Diagnosis not present

## 2022-09-16 DIAGNOSIS — L821 Other seborrheic keratosis: Secondary | ICD-10-CM | POA: Diagnosis not present

## 2022-09-16 DIAGNOSIS — D225 Melanocytic nevi of trunk: Secondary | ICD-10-CM | POA: Diagnosis not present

## 2022-09-16 DIAGNOSIS — D1801 Hemangioma of skin and subcutaneous tissue: Secondary | ICD-10-CM | POA: Diagnosis not present

## 2022-09-16 DIAGNOSIS — L918 Other hypertrophic disorders of the skin: Secondary | ICD-10-CM | POA: Diagnosis not present

## 2022-11-19 DIAGNOSIS — H903 Sensorineural hearing loss, bilateral: Secondary | ICD-10-CM | POA: Diagnosis not present

## 2022-12-30 DIAGNOSIS — C61 Malignant neoplasm of prostate: Secondary | ICD-10-CM | POA: Diagnosis not present

## 2023-01-06 DIAGNOSIS — C61 Malignant neoplasm of prostate: Secondary | ICD-10-CM | POA: Diagnosis not present

## 2023-01-06 DIAGNOSIS — N400 Enlarged prostate without lower urinary tract symptoms: Secondary | ICD-10-CM | POA: Diagnosis not present

## 2023-01-06 DIAGNOSIS — R350 Frequency of micturition: Secondary | ICD-10-CM | POA: Diagnosis not present

## 2023-01-06 DIAGNOSIS — R351 Nocturia: Secondary | ICD-10-CM | POA: Diagnosis not present

## 2023-06-18 DIAGNOSIS — L570 Actinic keratosis: Secondary | ICD-10-CM | POA: Diagnosis not present

## 2023-07-08 DIAGNOSIS — C61 Malignant neoplasm of prostate: Secondary | ICD-10-CM | POA: Diagnosis not present

## 2023-07-08 DIAGNOSIS — R350 Frequency of micturition: Secondary | ICD-10-CM | POA: Diagnosis not present

## 2023-07-30 ENCOUNTER — Ambulatory Visit: Payer: Medicare HMO | Admitting: Podiatry

## 2023-07-30 DIAGNOSIS — L600 Ingrowing nail: Secondary | ICD-10-CM | POA: Diagnosis not present

## 2023-07-30 NOTE — Progress Notes (Signed)
   Chief Complaint  Patient presents with   Ingrown Toenail    bil great toenails ingrown the left  one is worse.     - Subjective: Patient presents today for evaluation of pain to the lateral border left great toe. Patient is concerned for possible ingrown nail.  It is very sensitive to touch.  Patient presents today for further treatment and evaluation.  Past Medical History:  Diagnosis Date   Colon polyps    Diverticulitis    Diverticulosis    Prostate cancer ([facility]) 2021   injections every 6 months    Past Surgical History:  Procedure Laterality Date   COLONOSCOPY  2013   COLONOSCOPY WITH PROPOFOL  06/11/2017   Dr.Perry   FRACTURE SURGERY  1958   right leg   PROSTATE BIOPSY      No Known Allergies  Objective:  General: Well developed, nourished, in no acute distress, alert and oriented x3   Dermatology: Skin is warm, dry and supple bilateral.  Lateral border left great toe is tender with evidence of an ingrowing nail. Pain on palpation noted to the border of the nail fold. The remaining nails appear unremarkable at this time.   Vascular: DP and PT pulses palpable.  No clinical evidence of vascular compromise  Neruologic: Grossly intact via light touch bilateral.  Musculoskeletal: No pedal deformity noted  Assesement: #1 Paronychia with ingrowing nail lateral border left great toe  Plan of Care:  -Patient evaluated.  -Discussed treatment alternatives and plan of care. Explained nail avulsion procedure and post procedure course to patient. -Patient opted for permanent partial nail avulsion of the ingrown portion of the nail.  -Prior to procedure, local anesthesia infiltration utilized using 3 ml of a 50:50 mixture of 2% plain lidocaine and 0.5% plain marcaine in a normal hallux block fashion and a betadine prep performed.  -Partial permanent nail avulsion with chemical matrixectomy performed using 3x30sec applications of phenol followed by alcohol flush.  -Light  dressing applied.  Post care instructions provided -Return to clinic 3 weeks  Felecia Shelling, DPM [facility]  Dr. Felecia Shelling, DPM    [address] [place], [place] [address]                Office [phone]  Fax [phone]

## 2023-08-11 DIAGNOSIS — C61 Malignant neoplasm of prostate: Secondary | ICD-10-CM | POA: Diagnosis not present

## 2023-08-11 DIAGNOSIS — E785 Hyperlipidemia, unspecified: Secondary | ICD-10-CM | POA: Diagnosis not present

## 2023-08-11 DIAGNOSIS — I10 Essential (primary) hypertension: Secondary | ICD-10-CM | POA: Diagnosis not present

## 2023-08-11 DIAGNOSIS — E7800 Pure hypercholesterolemia, unspecified: Secondary | ICD-10-CM | POA: Diagnosis not present

## 2023-08-15 DIAGNOSIS — Z0000 Encounter for general adult medical examination without abnormal findings: Secondary | ICD-10-CM | POA: Diagnosis not present

## 2023-08-15 DIAGNOSIS — D72819 Decreased white blood cell count, unspecified: Secondary | ICD-10-CM | POA: Diagnosis not present

## 2023-08-15 DIAGNOSIS — D692 Other nonthrombocytopenic purpura: Secondary | ICD-10-CM | POA: Diagnosis not present

## 2023-08-15 DIAGNOSIS — Z1331 Encounter for screening for depression: Secondary | ICD-10-CM | POA: Diagnosis not present

## 2023-08-15 DIAGNOSIS — Z1339 Encounter for screening examination for other mental health and behavioral disorders: Secondary | ICD-10-CM | POA: Diagnosis not present

## 2023-08-15 DIAGNOSIS — H409 Unspecified glaucoma: Secondary | ICD-10-CM | POA: Diagnosis not present

## 2023-08-15 DIAGNOSIS — I10 Essential (primary) hypertension: Secondary | ICD-10-CM | POA: Diagnosis not present

## 2023-08-15 DIAGNOSIS — C61 Malignant neoplasm of prostate: Secondary | ICD-10-CM | POA: Diagnosis not present

## 2023-08-15 DIAGNOSIS — L719 Rosacea, unspecified: Secondary | ICD-10-CM | POA: Diagnosis not present

## 2023-08-15 DIAGNOSIS — Z23 Encounter for immunization: Secondary | ICD-10-CM | POA: Diagnosis not present

## 2023-08-15 DIAGNOSIS — R82998 Other abnormal findings in urine: Secondary | ICD-10-CM | POA: Diagnosis not present

## 2023-08-15 DIAGNOSIS — F5221 Male erectile disorder: Secondary | ICD-10-CM | POA: Diagnosis not present

## 2023-08-15 DIAGNOSIS — E7800 Pure hypercholesterolemia, unspecified: Secondary | ICD-10-CM | POA: Diagnosis not present

## 2023-10-22 DIAGNOSIS — L821 Other seborrheic keratosis: Secondary | ICD-10-CM | POA: Diagnosis not present

## 2023-10-22 DIAGNOSIS — D1801 Hemangioma of skin and subcutaneous tissue: Secondary | ICD-10-CM | POA: Diagnosis not present

## 2023-10-22 DIAGNOSIS — I788 Other diseases of capillaries: Secondary | ICD-10-CM | POA: Diagnosis not present

## 2023-10-22 DIAGNOSIS — L812 Freckles: Secondary | ICD-10-CM | POA: Diagnosis not present

## 2023-11-26 DIAGNOSIS — H2513 Age-related nuclear cataract, bilateral: Secondary | ICD-10-CM | POA: Diagnosis not present

## 2023-11-26 DIAGNOSIS — H35373 Puckering of macula, bilateral: Secondary | ICD-10-CM | POA: Diagnosis not present

## 2023-11-26 DIAGNOSIS — H52203 Unspecified astigmatism, bilateral: Secondary | ICD-10-CM | POA: Diagnosis not present

## 2024-05-12 DIAGNOSIS — M47812 Spondylosis without myelopathy or radiculopathy, cervical region: Secondary | ICD-10-CM | POA: Diagnosis not present

## 2024-05-12 DIAGNOSIS — M542 Cervicalgia: Secondary | ICD-10-CM | POA: Diagnosis not present

## 2024-05-12 DIAGNOSIS — M25512 Pain in left shoulder: Secondary | ICD-10-CM | POA: Diagnosis not present

## 2024-06-23 ENCOUNTER — Ambulatory Visit (INDEPENDENT_AMBULATORY_CARE_PROVIDER_SITE_OTHER)

## 2024-06-23 ENCOUNTER — Ambulatory Visit: Admitting: Podiatry

## 2024-06-23 ENCOUNTER — Encounter: Payer: Self-pay | Admitting: Podiatry

## 2024-06-23 VITALS — Ht 65.0 in | Wt 166.0 lb

## 2024-06-23 DIAGNOSIS — L600 Ingrowing nail: Secondary | ICD-10-CM

## 2024-06-23 DIAGNOSIS — M21611 Bunion of right foot: Secondary | ICD-10-CM | POA: Diagnosis not present

## 2024-06-23 DIAGNOSIS — M21619 Bunion of unspecified foot: Secondary | ICD-10-CM

## 2024-06-23 DIAGNOSIS — M21612 Bunion of left foot: Secondary | ICD-10-CM

## 2024-06-23 NOTE — Progress Notes (Signed)
   Chief Complaint  Patient presents with   Ingrown Toenail    Pt is here due to ingrown on right great toenail states it has been there for a while, has been soaking foot with warm water and epsom salt, states its better but still there.    Subjective: Patient presents today for evaluation of pain to the lateral border right great toe. Patient is concerned for possible ingrown nail.  It is very sensitive to touch.  Patient presents today for further treatment and evaluation.  Past Medical History:  Diagnosis Date   Colon polyps    Diverticulitis    Diverticulosis    Prostate cancer ([facility]) 2021   injections every 6 months    Past Surgical History:  Procedure Laterality Date   COLONOSCOPY  2013   COLONOSCOPY WITH PROPOFOL   06/11/2017   Dr.Perry   FRACTURE SURGERY  1958   right leg   PROSTATE BIOPSY      No Known Allergies  Objective:  General: Well developed, nourished, in no acute distress, alert and oriented x3   Dermatology: Skin is warm, dry and supple bilateral.  Lateral border right great toe is tender with evidence of an ingrowing nail. Pain on palpation noted to the border of the nail fold. The remaining nails appear unremarkable at this time.   Vascular: DP and PT pulses palpable.  No clinical evidence of vascular compromise  Neruologic: Grossly intact via light touch bilateral.  Musculoskeletal: No pedal deformity noted  Assesement: #1 Paronychia with ingrowing nail lateral border right great toe #2 history of partial nail matricectomy lateral border left great toe.  07/30/2023  Plan of Care:  -Patient evaluated.  -Discussed treatment alternatives and plan of care. Explained nail avulsion procedure and post procedure course to patient. -Patient opted for permanent partial nail avulsion of the ingrown portion of the nail.  -Prior to procedure, local anesthesia infiltration utilized using 3 ml of a 50:50 mixture of 2% plain lidocaine and 0.5% plain marcaine in a  normal hallux block fashion and a betadine prep performed.  -Partial permanent nail avulsion with chemical matrixectomy performed using 3x30sec applications of phenol followed by alcohol  flush.  -Light dressing applied.  Post care instructions provided -Return to clinic 3 weeks  Thresa EMERSON Sar, DPM [facility]  Dr. Thresa EMERSON Sar, DPM    [address] [place], [place] [address]                Office [phone]  Fax [phone]

## 2024-06-23 NOTE — Patient Instructions (Signed)

## 2024-08-10 DIAGNOSIS — C61 Malignant neoplasm of prostate: Secondary | ICD-10-CM | POA: Diagnosis not present

## 2024-08-18 DIAGNOSIS — I10 Essential (primary) hypertension: Secondary | ICD-10-CM | POA: Diagnosis not present

## 2024-08-18 DIAGNOSIS — E7800 Pure hypercholesterolemia, unspecified: Secondary | ICD-10-CM | POA: Diagnosis not present

## 2024-08-18 DIAGNOSIS — Z125 Encounter for screening for malignant neoplasm of prostate: Secondary | ICD-10-CM | POA: Diagnosis not present

## 2024-08-25 DIAGNOSIS — Z1331 Encounter for screening for depression: Secondary | ICD-10-CM | POA: Diagnosis not present

## 2024-08-25 DIAGNOSIS — Z1339 Encounter for screening examination for other mental health and behavioral disorders: Secondary | ICD-10-CM | POA: Diagnosis not present

## 2024-08-25 DIAGNOSIS — R82998 Other abnormal findings in urine: Secondary | ICD-10-CM | POA: Diagnosis not present

## 2024-08-25 DIAGNOSIS — I10 Essential (primary) hypertension: Secondary | ICD-10-CM | POA: Diagnosis not present

## 2024-10-26 DIAGNOSIS — L821 Other seborrheic keratosis: Secondary | ICD-10-CM | POA: Diagnosis not present

## 2024-10-26 DIAGNOSIS — D225 Melanocytic nevi of trunk: Secondary | ICD-10-CM | POA: Diagnosis not present

## 2024-10-26 DIAGNOSIS — L812 Freckles: Secondary | ICD-10-CM | POA: Diagnosis not present

## 2024-10-26 DIAGNOSIS — D2262 Melanocytic nevi of left upper limb, including shoulder: Secondary | ICD-10-CM | POA: Diagnosis not present

## 2024-10-26 DIAGNOSIS — L918 Other hypertrophic disorders of the skin: Secondary | ICD-10-CM | POA: Diagnosis not present

## 2024-10-26 DIAGNOSIS — L858 Other specified epidermal thickening: Secondary | ICD-10-CM | POA: Diagnosis not present
